# Patient Record
Sex: Female | Born: 1965 | Race: White | Hispanic: No | Marital: Married | Smoking: Never smoker
Health system: Southern US, Community
[De-identification: ages and names within clinical notes are randomized; demographics above are authoritative.]

## PROBLEM LIST (undated history)

## (undated) DIAGNOSIS — H04123 Dry eye syndrome of bilateral lacrimal glands: Secondary | ICD-10-CM

## (undated) DIAGNOSIS — K219 Gastro-esophageal reflux disease without esophagitis: Secondary | ICD-10-CM

## (undated) DIAGNOSIS — J45909 Unspecified asthma, uncomplicated: Secondary | ICD-10-CM

## (undated) HISTORY — DX: Gastro-esophageal reflux disease without esophagitis: K21.9

## (undated) HISTORY — DX: Dry eye syndrome of bilateral lacrimal glands: H04.123

---

## 2008-01-11 HISTORY — PX: SALPINGECTOMY: SHX328

## 2008-04-09 ENCOUNTER — Observation Stay: Payer: Self-pay | Admitting: Obstetrics and Gynecology

## 2010-02-17 ENCOUNTER — Ambulatory Visit: Payer: Self-pay | Admitting: Obstetrics and Gynecology

## 2011-07-08 DIAGNOSIS — H01006 Unspecified blepharitis left eye, unspecified eyelid: Secondary | ICD-10-CM

## 2011-07-08 DIAGNOSIS — H01003 Unspecified blepharitis right eye, unspecified eyelid: Secondary | ICD-10-CM | POA: Insufficient documentation

## 2011-07-08 DIAGNOSIS — H5213 Myopia, bilateral: Secondary | ICD-10-CM | POA: Insufficient documentation

## 2011-07-08 DIAGNOSIS — H04129 Dry eye syndrome of unspecified lacrimal gland: Secondary | ICD-10-CM | POA: Insufficient documentation

## 2011-07-08 DIAGNOSIS — H524 Presbyopia: Secondary | ICD-10-CM | POA: Insufficient documentation

## 2014-09-11 ENCOUNTER — Other Ambulatory Visit: Payer: Self-pay | Admitting: *Deleted

## 2014-09-11 MED ORDER — VITAMIN D (ERGOCALCIFEROL) 1.25 MG (50000 UNIT) PO CAPS: 50000.0000 [IU] | ORAL_CAPSULE | ORAL | Status: DC

## 2014-09-11 MED ORDER — FOLIC ACID 1 MG PO TABS: 1.0000 mg | ORAL_TABLET | Freq: Every day | ORAL | Status: DC

## 2014-12-02 ENCOUNTER — Telehealth: Payer: Self-pay | Admitting: Obstetrics and Gynecology

## 2014-12-02 NOTE — Telephone Encounter (Signed)
Does she need to keep taking folic acid , b/c she takes a muti vit gives her 100% folic acid

## 2014-12-02 NOTE — Telephone Encounter (Signed)
No, it sounds like she is getting enough from the [facility]

## 2014-12-03 NOTE — Telephone Encounter (Signed)
Pt informed not to take extra folic acid.

## 2015-06-11 ENCOUNTER — Encounter: Payer: Self-pay | Admitting: *Deleted

## 2015-06-16 ENCOUNTER — Encounter: Payer: Self-pay | Admitting: Obstetrics and Gynecology

## 2015-06-16 ENCOUNTER — Ambulatory Visit (INDEPENDENT_AMBULATORY_CARE_PROVIDER_SITE_OTHER): Payer: 59 | Admitting: Obstetrics and Gynecology

## 2015-06-16 VITALS — BP 100/54 | HR 57 | Ht 64.0 in | Wt 134.6 lb

## 2015-06-16 DIAGNOSIS — Z01419 Encounter for gynecological examination (general) (routine) without abnormal findings: Secondary | ICD-10-CM

## 2015-06-16 DIAGNOSIS — E559 Vitamin D deficiency, unspecified: Secondary | ICD-10-CM | POA: Diagnosis not present

## 2015-06-16 DIAGNOSIS — E785 Hyperlipidemia, unspecified: Secondary | ICD-10-CM

## 2015-06-16 NOTE — Patient Instructions (Addendum)
I recommend Bjorn PippinAmy Krebs, FNP at [facility] for [facility] care   Place annual gynecologic exam patient instructions here.  Thank you for enrolling in MyChart. Please follow the instructions below to securely access your online medical record. MyChart allows you to send messages to your doctor, view your test results, manage appointments, and more.   How Do I Sign Up? 1. In your Internet browser, go to [employer] Address [employer] and enter [email or web address]. 2. Click on the Sign Up Now link in the Sign In box. You will see the New Member Sign Up page. 3. Enter your MyChart Access Code exactly as it appears below. You will not need to use this code after you've completed the sign-up process. If you do not sign up before the expiration date, you must request a new code.  MyChart Access Code: [email or web address] Expires: 08/14/2015  3:45 PM  4. Enter your Social Security Number ([id number]) and Date of Birth (mm/dd/yyyy) as indicated and click Submit. You will be taken to the next sign-up page. 5. Create a MyChart ID. This will be your MyChart login ID and cannot be changed, so think of one that is secure and easy to remember. 6. Create a MyChart password. You can change your password at any time. 7. Enter your Password Reset Question and Answer. This can be used at a later time if you forget your password.  8. Enter your e-mail address. You will receive e-mail notification when new information is available in MyChart. 9. Click Sign Up. You can now view your medical record.   Additional Information Remember, MyChart is NOT to be used for urgent needs. For medical emergencies, dial 911.

## 2015-06-16 NOTE — Progress Notes (Signed)
Subjective:   Kelly Fleming is a 50 y.o. No obstetric history on file. Caucasian female here for a routine well-woman exam.  Patient's last menstrual period was 06/12/2015 (exact date).    Current complaints: none PCP: me       does desire labs  Social History: Sexual: heterosexual Marital Status: married Living situation: with family Occupation: homemaker Tobacco/alcohol: no tobacco use Illicit drugs: no history of illicit drug use  The following portions of the patient's history were reviewed and updated as appropriate: allergies, current medications, past family history, past medical history, past social history, past surgical history and problem list.  Past Medical History Past Medical History  Diagnosis Date  . Dry eyes   . GERD (gastroesophageal reflux disease)     Past Surgical History Past Surgical History  Procedure Laterality Date  . Salpingectomy  2010    Gynecologic History No obstetric history on file.  Patient's last menstrual period was 06/12/2015 (exact date). Contraception: none Last Pap: 2015. Results were: normal Last mammogram: 2012. Results were: normal  Obstetric History OB History  No data available    Current Medications Current Outpatient Prescriptions on File Prior to Visit  Medication Sig Dispense Refill  . Vitamin D, Ergocalciferol, (DRISDOL) 50000 UNITS CAPS capsule Take 1 capsule (50,000 Units total) by mouth every 7 (seven) days. 12 capsule 4   No current facility-administered medications on file prior to visit.    Review of Systems Patient denies any headaches, blurred vision, shortness of breath, chest pain, abdominal pain, problems with bowel movements, urination, or intercourse.  Objective:  BP 100/54 mmHg  Pulse 57  Ht 5\' 4"  (1.626 m)  Wt 134 lb 9 oz (61.037 kg)  BMI 23.09 kg/m2  LMP 06/12/2015 (Exact Date) Physical Exam  General:  Well developed, well nourished, no acute distress. She is alert and oriented x3. Skin:   Warm and dry Neck:  Midline trachea, no thyromegaly or nodules Cardiovascular: Regular rate and rhythm, no murmur heard Lungs:  Effort normal, all lung fields clear to auscultation bilaterally Breasts:  No dominant palpable mass, retraction, or nipple discharge Abdomen:  Soft, non tender, no hepatosplenomegaly or masses Pelvic:  External genitalia is normal in appearance.  The vagina is normal in appearance. The cervix is bulbous, no CMT.  Thin prep pap is not done  . Uterus is felt to be normal size, shape, and contour, but fibrous.  No adnexal masses or tenderness noted.Extremities:  No swelling or varicosities noted Psych:  She has a normal mood and affect  Assessment:   Healthy well-woman exam Hyperlipidemia H/O vit D Deficiency  Plan:  Labs obtained F/U 1 year for AE, or sooner if needed Mammogram scheduled  Vasily Fedewa Suzan NailerN Timera Windt, CNM

## 2015-06-19 LAB — COMPREHENSIVE METABOLIC PANEL
A/G RATIO: 1.6 (ref 1.2–2.2)
ALBUMIN: 4.5 g/dL (ref 3.5–5.5)
ALK PHOS: 57 IU/L (ref 39–117)
ALT: 16 IU/L (ref 0–32)
AST: 22 IU/L (ref 0–40)
BUN / CREAT RATIO: 18 (ref 9–23)
BUN: 11 mg/dL (ref 6–24)
Bilirubin Total: 0.3 mg/dL (ref 0.0–1.2)
CHLORIDE: 99 mmol/L (ref 96–106)
CO2: 26 mmol/L (ref 18–29)
Calcium: 9.3 mg/dL (ref 8.7–10.2)
Creatinine, Ser: 0.62 mg/dL (ref 0.57–1.00)
GFR calc Af Amer: 122 mL/min/{1.73_m2} (ref >59)
GFR calc non Af Amer: 106 mL/min/{1.73_m2} (ref >59)
GLOBULIN, TOTAL: 2.8 g/dL (ref 1.5–4.5)
Glucose: 91 mg/dL (ref 65–99)
POTASSIUM: 4.4 mmol/L (ref 3.5–5.2)
SODIUM: 140 mmol/L (ref 134–144)
Total Protein: 7.3 g/dL (ref 6.0–8.5)

## 2015-06-19 LAB — NMR, LIPOPROFILE
Cholesterol: 192 mg/dL (ref 100–199)
HDL Cholesterol by NMR: 53 mg/dL
HDL Particle Number: 31.7 umol/L
LDL Particle Number: 1348 nmol/L — ABNORMAL HIGH
LDL Size: 21.7 nm
LDL-C: 113 mg/dL — ABNORMAL HIGH (ref 0–99)
LP-IR Score: <25
Small LDL Particle Number: 350 nmol/L
Triglycerides by NMR: 129 mg/dL (ref 0–149)

## 2015-06-19 LAB — VITAMIN D 25 HYDROXY (VIT D DEFICIENCY, FRACTURES): VIT D 25 HYDROXY: 55.6 ng/mL (ref 30.0–100.0)

## 2015-06-24 ENCOUNTER — Ambulatory Visit
Admission: RE | Admit: 2015-06-24 | Discharge: 2015-06-24 | Disposition: A | Discharge status: Home / Self Care | Payer: 59 | Source: Ambulatory Visit | Attending: Obstetrics and Gynecology | Admitting: Obstetrics and Gynecology

## 2015-06-24 DIAGNOSIS — Z1231 Encounter for screening mammogram for malignant neoplasm of breast: Secondary | ICD-10-CM | POA: Diagnosis present

## 2015-06-24 DIAGNOSIS — Z01419 Encounter for gynecological examination (general) (routine) without abnormal findings: Secondary | ICD-10-CM

## 2015-06-25 ENCOUNTER — Other Ambulatory Visit: Payer: Self-pay | Admitting: Obstetrics and Gynecology

## 2015-06-25 DIAGNOSIS — R928 Other abnormal and inconclusive findings on diagnostic imaging of breast: Secondary | ICD-10-CM

## 2015-07-10 ENCOUNTER — Ambulatory Visit
Admission: RE | Admit: 2015-07-10 | Discharge: 2015-07-10 | Disposition: A | Discharge status: Home / Self Care | Payer: 59 | Source: Ambulatory Visit | Attending: Obstetrics and Gynecology | Admitting: Obstetrics and Gynecology

## 2015-07-10 DIAGNOSIS — R928 Other abnormal and inconclusive findings on diagnostic imaging of breast: Secondary | ICD-10-CM

## 2015-07-10 DIAGNOSIS — N6002 Solitary cyst of left breast: Secondary | ICD-10-CM | POA: Diagnosis not present

## 2015-07-22 ENCOUNTER — Telehealth: Payer: Self-pay | Admitting: *Deleted

## 2015-07-22 NOTE — Telephone Encounter (Signed)
Mailed letter to pt

## 2015-07-22 NOTE — Telephone Encounter (Signed)
-----   Message from Purcell NailsMelody N Shambley, [place] sent at 07/10/2015  2:02 PM EDT ----- Please let her know I have reviewed her follow MMG and ultrasound, and agree with recommended follow up in 6 months.

## 2015-09-08 ENCOUNTER — Other Ambulatory Visit: Payer: Self-pay | Admitting: *Deleted

## 2015-09-08 MED ORDER — VITAMIN D (ERGOCALCIFEROL) 1.25 MG (50000 UNIT) PO CAPS: 50000.0000 [IU] | ORAL_CAPSULE | ORAL | 4 refills | Status: DC

## 2016-06-16 ENCOUNTER — Encounter: Payer: 59 | Admitting: Obstetrics and Gynecology

## 2016-08-19 ENCOUNTER — Telehealth: Payer: Self-pay | Admitting: Obstetrics and Gynecology

## 2016-08-19 NOTE — Telephone Encounter (Signed)
Patient would like to have mammogram the same day as she has her AE - could you put in the order so that she can schedule that please  Thank you

## 2016-08-22 ENCOUNTER — Telehealth: Payer: Self-pay | Admitting: Obstetrics and Gynecology

## 2016-08-22 NOTE — Telephone Encounter (Signed)
Patient called requesting an order for her diagnostic mammo. She wants to have her mammo done on the same day as her annual if possible but needs the order in. Thanks

## 2016-08-23 ENCOUNTER — Other Ambulatory Visit: Payer: Self-pay | Admitting: Obstetrics and Gynecology

## 2016-08-23 DIAGNOSIS — R928 Other abnormal and inconclusive findings on diagnostic imaging of breast: Secondary | ICD-10-CM

## 2016-08-23 NOTE — Telephone Encounter (Signed)
Please let her know orders are in so she can schedule.

## 2016-09-09 ENCOUNTER — Encounter: Payer: 59 | Admitting: Obstetrics and Gynecology

## 2016-09-09 ENCOUNTER — Telehealth: Payer: Self-pay | Admitting: Obstetrics and Gynecology

## 2016-09-09 NOTE — Telephone Encounter (Signed)
The patient called and stated that she asked her pharmacy to send a medication refill request, The patient stated that she still does not have her medication. The patient did not disclose any other information, Please advise.

## 2016-09-16 ENCOUNTER — Other Ambulatory Visit: Payer: Self-pay | Admitting: *Deleted

## 2016-09-16 MED ORDER — VITAMIN D (ERGOCALCIFEROL) 1.25 MG (50000 UNIT) PO CAPS: 50000.0000 [IU] | ORAL_CAPSULE | ORAL | 4 refills | Status: DC

## 2016-09-20 ENCOUNTER — Ambulatory Visit
Admission: RE | Admit: 2016-09-20 | Discharge: 2016-09-20 | Disposition: A | Discharge status: Home / Self Care | Payer: Managed Care, Other (non HMO) | Source: Ambulatory Visit | Attending: Obstetrics and Gynecology | Admitting: Obstetrics and Gynecology

## 2016-09-20 ENCOUNTER — Other Ambulatory Visit: Payer: Self-pay | Admitting: Obstetrics and Gynecology

## 2016-09-20 ENCOUNTER — Ambulatory Visit (INDEPENDENT_AMBULATORY_CARE_PROVIDER_SITE_OTHER): Payer: Managed Care, Other (non HMO) | Admitting: Obstetrics and Gynecology

## 2016-09-20 ENCOUNTER — Encounter: Payer: Self-pay | Admitting: Obstetrics and Gynecology

## 2016-09-20 VITALS — BP 95/47 | HR 73 | Ht 64.0 in | Wt 133.3 lb

## 2016-09-20 DIAGNOSIS — Z01419 Encounter for gynecological examination (general) (routine) without abnormal findings: Secondary | ICD-10-CM

## 2016-09-20 DIAGNOSIS — R928 Other abnormal and inconclusive findings on diagnostic imaging of breast: Secondary | ICD-10-CM

## 2016-09-20 DIAGNOSIS — E785 Hyperlipidemia, unspecified: Secondary | ICD-10-CM

## 2016-09-20 MED ORDER — VITAMIN D (ERGOCALCIFEROL) 1.25 MG (50000 UNIT) PO CAPS: 50000.0000 [IU] | ORAL_CAPSULE | ORAL | 4 refills | Status: DC

## 2016-09-20 NOTE — Progress Notes (Signed)
Subjective:   Kelly Fleming is a 51 y.o. No obstetric history on file. Caucasian female here for a routine well-woman exam.  Patient's last menstrual period was 09/15/2016.    Current complaints: occasional night sweats. Monthly menses. Walk/run, cardio, gardening.  PCP: me       does desire labs  Social History: Sexual: heterosexual Marital Status: married Living situation: with family Occupation: homemaker Tobacco/alcohol: no tobacco use Illicit drugs: no history of illicit drug use  The following portions of the patient's history were reviewed and updated as appropriate: allergies, current medications, past family history, past medical history, past social history, past surgical history and problem list.  Past Medical History Past Medical History:  Diagnosis Date  . Dry eyes   . GERD (gastroesophageal reflux disease)     Past Surgical History Past Surgical History:  Procedure Laterality Date  . SALPINGECTOMY  2010    Gynecologic History No obstetric history on file.  Patient's last menstrual period was 09/15/2016. Contraception: tubal ligation Last Pap: 2015. Results were: normal Last mammogram: 2017. Results were: left breast cyst otherwise normal   Obstetric History OB History  No data available    Current Medications Current Outpatient Prescriptions on File Prior to Visit  Medication Sig Dispense Refill  . XIIDRA 5 % SOLN INSTILL 1 DROP OU BID  6   No current facility-administered medications on file prior to visit.     Review of Systems Patient denies any headaches, blurred vision, shortness of breath, chest pain, abdominal pain, problems with bowel movements, urination, or intercourse.  Objective:  BP (!) 95/47   Pulse 73   Ht 5\' 4"  (1.626 m)   Wt 133 lb 4.8 oz (60.5 kg)   LMP 09/15/2016   BMI 22.88 kg/m  Physical Exam  General:  Well developed, well nourished, no acute distress. She is alert and oriented x3. Skin:  Warm and dry Neck:   Midline trachea, no thyromegaly or nodules Cardiovascular: Regular rate and rhythm, no murmur heard Lungs:  Effort normal, all lung fields clear to auscultation bilaterally Breasts:  No dominant palpable mass, retraction, or nipple discharge Abdomen:  Soft, non tender, no hepatosplenomegaly or masses Pelvic:  External genitalia is normal in appearance.  The vagina is normal in appearance. The cervix is bulbous, no CMT.  Thin prep pap is done with HR HPV cotesting. Uterus is felt to be normal size, shape, and contour.  No adnexal masses or tenderness noted. Extremities:  No swelling or varicosities noted Psych:  She has a normal mood and affect  Assessment:   Healthy well-woman exam Hyperlipidema   Plan:  Labs obtained and will follow up accordingly F/U 1 Year for Ae, or sooner if needed Mammogram ordered Colonoscopy needed  Margaruite Top Suzan NailerN Jermane Brayboy, CNM

## 2016-09-21 LAB — COMPREHENSIVE METABOLIC PANEL
ALBUMIN: 4.5 g/dL (ref 3.5–5.5)
ALK PHOS: 55 IU/L (ref 39–117)
ALT: 12 IU/L (ref 0–32)
AST: 18 IU/L (ref 0–40)
Albumin/Globulin Ratio: 1.8 (ref 1.2–2.2)
BUN / CREAT RATIO: 19 (ref 9–23)
BUN: 17 mg/dL (ref 6–24)
Bilirubin Total: 0.2 mg/dL (ref 0.0–1.2)
CALCIUM: 9.5 mg/dL (ref 8.7–10.2)
CO2: 25 mmol/L (ref 20–29)
CREATININE: 0.89 mg/dL (ref 0.57–1.00)
Chloride: 102 mmol/L (ref 96–106)
GFR, EST AFRICAN AMERICAN: 87 mL/min/{1.73_m2} (ref >59)
GFR, EST NON AFRICAN AMERICAN: 76 mL/min/{1.73_m2} (ref >59)
GLUCOSE: 87 mg/dL (ref 65–99)
Globulin, Total: 2.5 g/dL (ref 1.5–4.5)
Potassium: 5.1 mmol/L (ref 3.5–5.2)
Sodium: 142 mmol/L (ref 134–144)
TOTAL PROTEIN: 7 g/dL (ref 6.0–8.5)

## 2016-09-21 LAB — NMR, LIPOPROFILE
CHOLESTEROL: 207 mg/dL — AB (ref 100–199)
HDL CHOLESTEROL BY NMR: 58 mg/dL (ref >39)
HDL Particle Number: 31.6 umol/L (ref >=30.5)
LDL Particle Number: 1307 nmol/L — ABNORMAL HIGH (ref <1000)
LDL Size: 22.1 nm (ref >20.5)
LDL-C: 125 mg/dL — ABNORMAL HIGH (ref 0–99)
LP-IR Score: <25 (ref <=45)
SMALL LDL PARTICLE NUMBER: 185 nmol/L (ref <=527)
TRIGLYCERIDES BY NMR: 122 mg/dL (ref 0–149)

## 2016-09-21 LAB — HEMOGLOBIN A1C
ESTIMATED AVERAGE GLUCOSE: 100 mg/dL
Hgb A1c MFr Bld: 5.1 % (ref 4.8–5.6)

## 2016-09-21 LAB — VITAMIN D 25 HYDROXY (VIT D DEFICIENCY, FRACTURES): VIT D 25 HYDROXY: 58.9 ng/mL (ref 30.0–100.0)

## 2016-09-22 LAB — CYTOLOGY - PAP

## 2016-10-04 ENCOUNTER — Telehealth: Payer: Self-pay | Admitting: *Deleted

## 2016-10-04 NOTE — Telephone Encounter (Signed)
Mailed info to pt 

## 2016-10-06 ENCOUNTER — Telehealth: Payer: Self-pay | Admitting: Obstetrics and Gynecology

## 2016-10-06 NOTE — Telephone Encounter (Signed)
Patient called and stated that she has a couple of questions to ask Amy or Melody. Patient did not disclose any other information other that wanting to speak with someone. Please advise.

## 2016-10-07 NOTE — Telephone Encounter (Signed)
Pt would like for dx code to be changed for her vit d

## 2016-10-07 NOTE — Telephone Encounter (Signed)
Pt LVM asking that Melody or Amy return her call b/c she has questions about a procedure that was done. Please advise. Thanks TNP

## 2016-10-26 ENCOUNTER — Other Ambulatory Visit: Payer: Self-pay

## 2016-10-26 ENCOUNTER — Telehealth: Payer: Self-pay

## 2016-10-26 DIAGNOSIS — Z1211 Encounter for screening for malignant neoplasm of colon: Secondary | ICD-10-CM

## 2016-10-26 NOTE — Telephone Encounter (Signed)
Gastroenterology Pre-Procedure Review  Request Date:  Requesting Physician: Dr.   PATIENT REVIEW QUESTIONS: The patient responded to the following health history questions as indicated:    1. Are you having any GI issues? no 2. Do you have a personal history of Polyps? no 3. Do you have a family history of Colon Cancer or Polyps? no 4. Diabetes Mellitus? no 5. Joint replacements in the past 12 months?no 6. Major health problems in the past 3 months?no 7. Any artificial heart valves, MVP, or defibrillator?no    MEDICATIONS & ALLERGIES:    Patient reports the following regarding taking any anticoagulation/antiplatelet therapy:   Plavix, Coumadin, Eliquis, Xarelto, Lovenox, Pradaxa, Brilinta, or Effient? no Aspirin? no  Patient confirms/reports the following medications:  Current Outpatient Prescriptions  Medication Sig Dispense Refill  . Vitamin D, Ergocalciferol, (DRISDOL) 50000 units CAPS capsule Take 1 capsule (50,000 Units total) by mouth every 7 (seven) days. 30 capsule 4  . XIIDRA 5 % SOLN INSTILL 1 DROP OU BID  6   No current facility-administered medications for this visit.     Patient confirms/reports the following allergies:  Allergies  Allergen Reactions  . Iodinated Diagnostic Agents Anaphylaxis    Uncoded Allergy. Allergen: freshwater fish  . Fish Allergy   . Penicillins     No orders of the defined types were placed in this encounter.   AUTHORIZATION INFORMATION Primary Insurance: 1D#: Group #:  Secondary Insurance: 1D#: Group #:  SCHEDULE INFORMATION: Date: 11/25/16 Time: Location: [facility]

## 2016-11-21 ENCOUNTER — Encounter: Payer: Self-pay | Admitting: *Deleted

## 2016-11-22 NOTE — Discharge Instructions (Signed)
General Anesthesia, Adult, Care After °These instructions provide you with information about caring for yourself after your procedure. Your health care provider may also give you more specific instructions. Your treatment has been planned according to current medical practices, but problems sometimes occur. Call your health care provider if you have any problems or questions after your procedure. °What can I expect after the procedure? °After the procedure, it is common to have: °· Vomiting. °· A sore throat. °· Mental slowness. ° °It is common to feel: °· Nauseous. °· Cold or shivery. °· Sleepy. °· Tired. °· Sore or achy, even in parts of your body where you did not have surgery. ° °Follow these instructions at home: °For at least 24 hours after the procedure: °· Do not: °? Participate in activities where you could fall or become injured. °? Drive. °? Use heavy machinery. °? Drink alcohol. °? Take sleeping pills or medicines that cause drowsiness. °? Make important decisions or sign legal documents. °? Take care of children on your own. °· Rest. °Eating and drinking °· If you vomit, drink water, juice, or soup when you can drink without vomiting. °· Drink enough fluid to keep your urine clear or pale yellow. °· Make sure you have little or no nausea before eating solid foods. °· Follow the diet recommended by your health care provider. °General instructions °· Have a responsible adult stay with you until you are awake and alert. °· Return to your normal activities as told by your health care provider. Ask your health care provider what activities are safe for you. °· Take over-the-counter and prescription medicines only as told by your health care provider. °· If you smoke, do not smoke without supervision. °· Keep all follow-up visits as told by your health care provider. This is important. °Contact a health care provider if: °· You continue to have nausea or vomiting at home, and medicines are not helpful. °· You  cannot drink fluids or start eating again. °· You cannot urinate after 8-12 hours. °· You develop a skin rash. °· You have fever. °· You have increasing redness at the site of your procedure. °Get help right away if: °· You have difficulty breathing. °· You have chest pain. °· You have unexpected bleeding. °· You feel that you are having a life-threatening or urgent problem. °This information is not intended to replace advice given to you by your health care provider. Make sure you discuss any questions you have with your health care provider. °Document Released: 04/04/2000 Document Revised: 06/01/2015 Document Reviewed: 12/11/2014 °Elsevier Interactive Patient Education © 2018 Elsevier Inc. ° °

## 2016-11-25 ENCOUNTER — Ambulatory Visit
Admission: RE | Admit: 2016-11-25 | Discharge: 2016-11-25 | Disposition: A | Discharge status: Home / Self Care | Payer: Managed Care, Other (non HMO) | Source: Ambulatory Visit | Attending: Gastroenterology | Admitting: Gastroenterology

## 2016-11-25 ENCOUNTER — Encounter
Admission: RE | Disposition: A | Discharge status: Home / Self Care | Payer: Self-pay | Source: Ambulatory Visit | Attending: Gastroenterology

## 2016-11-25 ENCOUNTER — Ambulatory Visit: Payer: Managed Care, Other (non HMO) | Admitting: Anesthesiology

## 2016-11-25 DIAGNOSIS — Z1211 Encounter for screening for malignant neoplasm of colon: Secondary | ICD-10-CM

## 2016-11-25 DIAGNOSIS — K640 First degree hemorrhoids: Secondary | ICD-10-CM | POA: Diagnosis not present

## 2016-11-25 DIAGNOSIS — Z79899 Other long term (current) drug therapy: Secondary | ICD-10-CM | POA: Diagnosis not present

## 2016-11-25 DIAGNOSIS — K219 Gastro-esophageal reflux disease without esophagitis: Secondary | ICD-10-CM | POA: Diagnosis not present

## 2016-11-25 HISTORY — PX: COLONOSCOPY WITH PROPOFOL: SHX5780

## 2016-11-25 HISTORY — DX: Unspecified asthma, uncomplicated: J45.909

## 2016-11-25 SURGERY — COLONOSCOPY WITH PROPOFOL
Anesthesia: General | Site: Rectum | Wound class: Contaminated

## 2016-11-25 MED ORDER — SIMETHICONE 40 MG/0.6ML PO SUSP
ORAL | Status: DC | PRN
  Administered 2016-11-25: 10:00:00

## 2016-11-25 MED ORDER — ACETAMINOPHEN 160 MG/5ML PO SOLN: 325.0000 mg | ORAL | Status: DC | PRN

## 2016-11-25 MED ORDER — LACTATED RINGERS IV SOLN
1000.0000 mL | INTRAVENOUS | Status: DC
  Administered 2016-11-25: 10:00:00 via INTRAVENOUS

## 2016-11-25 MED ORDER — ACETAMINOPHEN 325 MG PO TABS: 325.0000 mg | ORAL_TABLET | ORAL | Status: DC | PRN

## 2016-11-25 MED ORDER — PROPOFOL 10 MG/ML IV BOLUS
INTRAVENOUS | Status: DC | PRN
  Administered 2016-11-25: 50 mg via INTRAVENOUS
  Administered 2016-11-25: 100 mg via INTRAVENOUS
  Administered 2016-11-25: 20 mg via INTRAVENOUS
  Administered 2016-11-25 (×2): 30 mg via INTRAVENOUS

## 2016-11-25 MED ORDER — LIDOCAINE HCL (CARDIAC) 20 MG/ML IV SOLN
INTRAVENOUS | Status: DC | PRN
  Administered 2016-11-25: 50 mg via INTRAVENOUS

## 2016-11-25 MED ORDER — PHENYLEPHRINE HCL 10 MG/ML IJ SOLN
INTRAMUSCULAR | Status: DC | PRN
  Administered 2016-11-25 (×2): 50 ug via INTRAVENOUS

## 2016-11-25 SURGICAL SUPPLY — 23 items

## 2016-11-25 NOTE — H&P (Signed)
   Midge Miniumarren Jordin Dambrosio, MD [facility] [address] [place], [place] [address] Phone: [phone] Fax : [phone]  Primary Care Physician:  Patient, No Pcp Per Primary Gastroenterologist:  Dr. Servando SnareWohl  Pre-Procedure History & Physical: HPI:  Kelly Numberlison Doubek is a 51 y.o. female is here for a screening colonoscopy.   Past Medical History:  Diagnosis Date  . Childhood asthma   . Dry eyes   . Family history of adverse reaction to anesthesia    Maternal aunt - Anesthesia caused onset of dementia  . GERD (gastroesophageal reflux disease)     Past Surgical History:  Procedure Laterality Date  . SALPINGECTOMY  2010    Prior to Admission medications   Medication Sig Start Date End Date Taking? Authorizing Provider  Vitamin D, Ergocalciferol, (DRISDOL) 50000 units CAPS capsule Take 1 capsule (50,000 Units total) by mouth every 7 (seven) days. 09/20/16  Yes Shambley, Melody N, CNM  XIIDRA 5 % SOLN INSTILL 1 DROP OU BID 04/06/15   [provider]    Allergies as of 10/26/2016 - Review Complete 09/20/2016  Allergen Reaction Noted  . Iodinated diagnostic agents Anaphylaxis 06/16/2015  . Fish allergy  06/11/2015  . Penicillins  06/11/2015    Family History  Problem Relation Age of Onset  . Breast cancer Cousin        mat cousin  . Diabetes Father   . Diabetes Maternal Grandfather   . Hyperlipidemia Maternal Grandfather   . Diabetes Paternal Grandfather   . Hyperlipidemia Paternal Grandfather     Social History   Socioeconomic History  . Marital status: Married    Spouse name: Not on file  . Number of children: Not on file  . Years of education: Not on file  . Highest education level: Not on file  Social Needs  . Financial resource strain: Not on file  . Food insecurity - worry: Not on file  . Food insecurity - inability: Not on file  . Transportation needs - medical: Not on file  . Transportation needs - non-medical: Not on file  Occupational History  . Not on  file  Tobacco Use  . Smoking status: Never Smoker  . Smokeless tobacco: Never Used  Substance and Sexual Activity  . Alcohol use: No  . Drug use: No  . Sexual activity: Yes  Other Topics Concern  . Not on file  Social History Narrative  . Not on file    Review of Systems: See HPI, otherwise negative ROS  Physical Exam: BP (!) 97/43   Pulse 60   Temp (!) 97.5 F (36.4 C) (Temporal)   Ht 5\' 4"  (1.626 m)   Wt 128 lb (58.1 kg)   LMP 11/08/2016 (Approximate)   SpO2 100%   BMI 21.97 kg/m  General:   Alert,  pleasant and cooperative in NAD Head:  Normocephalic and atraumatic. Neck:  Supple; no masses or thyromegaly. Lungs:  Clear throughout to auscultation.    Heart:  Regular rate and rhythm. Abdomen:  Soft, nontender and nondistended. Normal bowel sounds, without guarding, and without rebound.   Neurologic:  Alert and  oriented x4;  grossly normal neurologically.  Impression/Plan: Kelly Fleming is now here to undergo a screening colonoscopy.  Risks, benefits, and alternatives regarding colonoscopy have been reviewed with the patient.  Questions have been answered.  All parties agreeable.

## 2016-11-25 NOTE — Op Note (Signed)
[facility] Gastroenterology Patient Name: Kelly Fleming Procedure Date: 11/25/2016 10:05 AM MRN: [id number] Account #: [id number] Date of Birth: [birth year] Admit Type: Outpatient Age: [age] [facility] 01 Gender: Female Note Status: Finalized Procedure:            Colonoscopy Indications:          Screening for colorectal malignant neoplasm Providers:            Midge Miniumarren Brinley Treanor MD, MD Medicines:            Propofol per Anesthesia Complications:        No immediate complications. Procedure:            Pre-Anesthesia Assessment:                       - Prior to the procedure, a History and Physical was                        performed, and patient medications and allergies were                        reviewed. The patient's tolerance of previous                        anesthesia was also reviewed. The risks and benefits of                        the procedure and the sedation options and risks were                        discussed with the patient. All questions were                        answered, and informed consent was obtained. Prior                        Anticoagulants: The patient has taken no previous                        anticoagulant or antiplatelet agents. ASA Grade                        Assessment: II - A patient with mild systemic disease.                        After reviewing the risks and benefits, the patient was                        deemed in satisfactory condition to undergo the                        procedure.                       After obtaining informed consent, the colonoscope was                        passed under direct vision. Throughout the procedure,                        the patient's blood pressure,  pulse, and oxygen                        saturations were monitored continuously. The Olympus CF                        H180AL Colonoscope (S#: [id number]) was introduced through                        the anus and advanced to  the the cecum, identified by                        appendiceal orifice and ileocecal valve. The                        colonoscopy was performed without difficulty. The                        patient tolerated the procedure well. The quality of                        the bowel preparation was excellent. Findings:      The perianal and digital rectal examinations were normal.      Non-bleeding internal hemorrhoids were found during retroflexion. The       hemorrhoids were Grade I (internal hemorrhoids that do not prolapse). Impression:           - Non-bleeding internal hemorrhoids.                       - No specimens collected. Recommendation:       - Discharge patient to home.                       - Resume previous diet.                       - Continue present medications.                       - Repeat colonoscopy in 10 years for screening unless                        any change in family history or lower GI problems. Procedure Code(s):    --- Professional ---                       [phone], Colonoscopy, flexible; diagnostic, including                        collection of specimen(s) by brushing or washing, when                        performed (separate procedure) Diagnosis Code(s):    --- Professional ---                       Z12.11, Encounter for screening for malignant neoplasm                        of colon CPT copyright 2016 American Medical Association. All rights reserved. The codes documented in this report are preliminary and upon coder review  may  be revised to meet current compliance requirements. Midge Minium MD, MD 11/25/2016 10:28:39 AM This report has been signed electronically. Number of Addenda: 0 Note Initiated On: 11/25/2016 10:05 AM Scope Withdrawal Time: 0 hours 6 minutes 6 seconds  Total Procedure Duration: 0 hours 12 minutes 44 seconds       [facility]

## 2016-11-25 NOTE — Transfer of Care (Signed)
Immediate Anesthesia Transfer of Care Note  Patient: Kelly Fleming  Procedure(s) Performed: COLONOSCOPY WITH PROPOFOL (N/A Rectum)  Patient Location: PACU  Anesthesia Type: General  Level of Consciousness: awake, alert  and patient cooperative  Airway and Oxygen Therapy: Patient Spontanous Breathing and Patient connected to supplemental oxygen  Post-op Assessment: Post-op Vital signs reviewed, Patient's Cardiovascular Status Stable, Respiratory Function Stable, Patent Airway and No signs of Nausea or vomiting  Post-op Vital Signs: Reviewed and stable  Complications: No apparent anesthesia complications

## 2016-11-25 NOTE — Anesthesia Preprocedure Evaluation (Signed)
Anesthesia Evaluation  Patient identified by MRN, date of birth, ID band Patient awake    Reviewed: Allergy & Precautions, H&P , NPO status , Patient's Chart, lab work & pertinent test results, reviewed documented beta blocker date and time   Airway Mallampati: II  TM Distance: >3 FB Neck ROM: full    Dental no notable dental hx.    Pulmonary neg pulmonary ROS,    Pulmonary exam normal breath sounds clear to auscultation       Cardiovascular Exercise Tolerance: Good negative cardio ROS Normal cardiovascular exam Rhythm:regular Rate:Normal     Neuro/Psych negative neurological ROS  negative psych ROS   GI/Hepatic Neg liver ROS, GERD  ,  Endo/Other  negative endocrine ROS  Renal/GU negative Renal ROS  negative genitourinary   Musculoskeletal   Abdominal   Peds  Hematology negative hematology ROS (+)   Anesthesia Other Findings   Reproductive/Obstetrics negative OB ROS                             Anesthesia Physical Anesthesia Plan  ASA: II  Anesthesia Plan: General   Post-op Pain Management:    Induction:   PONV Risk Score and Plan:   Airway Management Planned:   Additional Equipment:   Intra-op Plan:   Post-operative Plan:   Informed Consent: I have reviewed the patients History and Physical, chart, labs and discussed the procedure including the risks, benefits and alternatives for the proposed anesthesia with the patient or authorized representative who has indicated his/her understanding and acceptance.   Dental Advisory Given  Plan Discussed with: CRNA and Anesthesiologist  Anesthesia Plan Comments:         Anesthesia Quick Evaluation  

## 2016-11-25 NOTE — Anesthesia Postprocedure Evaluation (Signed)
Anesthesia Post Note  Patient: Kelly Fleming  Procedure(s) Performed: COLONOSCOPY WITH PROPOFOL (N/A Rectum)  Patient location during evaluation: PACU Anesthesia Type: General Level of consciousness: awake and alert Pain management: pain level controlled Vital Signs Assessment: post-procedure vital signs reviewed and stable Respiratory status: spontaneous breathing, nonlabored ventilation, respiratory function stable and patient connected to nasal cannula oxygen Cardiovascular status: blood pressure returned to baseline and stable Postop Assessment: no apparent nausea or vomiting Anesthetic complications: no    Alta CorningBacon, Barbar Brede S

## 2016-11-25 NOTE — Anesthesia Procedure Notes (Signed)
Date/Time: 11/25/2016 10:08 AM Performed by: Maree KrabbeWarren, Atziri Zubiate, CRNA Pre-anesthesia Checklist: Patient identified, Emergency Drugs available, Suction available, Timeout performed and Patient being monitored Patient Re-evaluated:Patient Re-evaluated prior to induction Oxygen Delivery Method: Nasal cannula Placement Confirmation: positive ETCO2

## 2017-04-14 ENCOUNTER — Encounter: Payer: Self-pay | Admitting: Emergency Medicine

## 2017-04-14 ENCOUNTER — Ambulatory Visit
Admission: EM | Admit: 2017-04-14 | Discharge: 2017-04-14 | Disposition: A | Discharge status: Home / Self Care | Payer: BLUE CROSS/BLUE SHIELD | Attending: Emergency Medicine | Admitting: Emergency Medicine

## 2017-04-14 ENCOUNTER — Other Ambulatory Visit: Payer: Self-pay

## 2017-04-14 DIAGNOSIS — R300 Dysuria: Secondary | ICD-10-CM

## 2017-04-14 DIAGNOSIS — N3001 Acute cystitis with hematuria: Secondary | ICD-10-CM | POA: Diagnosis not present

## 2017-04-14 LAB — URINALYSIS, COMPLETE (UACMP) WITH MICROSCOPIC
BILIRUBIN URINE: NEGATIVE
Glucose, UA: NEGATIVE mg/dL
KETONES UR: NEGATIVE mg/dL
NITRITE: POSITIVE — AB
PROTEIN: NEGATIVE mg/dL
Specific Gravity, Urine: 1.025 (ref 1.005–1.030)
pH: 5.5 (ref 5.0–8.0)

## 2017-04-14 MED ORDER — NITROFURANTOIN MONOHYD MACRO 100 MG PO CAPS: 100.0000 mg | ORAL_CAPSULE | Freq: Two times a day (BID) | ORAL | 0 refills | Status: DC

## 2017-04-14 MED ORDER — PHENAZOPYRIDINE HCL 200 MG PO TABS: 200.0000 mg | ORAL_TABLET | Freq: Three times a day (TID) | ORAL | 0 refills | Status: DC | PRN

## 2017-04-14 NOTE — Discharge Instructions (Addendum)
Here is a list of primary care providers who are taking new patients: ° °Dr. Deanna Jones, Dr. William Plonk °[address] °[address] °[place] [address] °[phone] ° °[facility] [place] °[address]  °[place] [address]  °[phone] ° °[facility] °[address]  °[place], [place] [address] °[phone] ° °[facility] [place] °[address]  °[phone] °[place], [place] [address] ° °Here are clinics/ other resources who will see you if you do not have insurance. Some have certain criteria that you must meet. Call them and find out what they are: ° °[facility]: °[address] [place] [place], [place] [address] °Phone: [phone] °Hours: First and Third Saturdays of each Month, 9 a.m. - 1 p.m. ° °[facility]: °[address], [place], [place] [address] °Phone: [phone] °Hours: °Tuesday, 4 p.m. - 8 p.m. °Thursday, 1 p.m. - 8 p.m. °Wednesday, 9 a.m. - Noon ° °[facility] °[address], [place], [place] [address] °Phone: [phone] °Pharmacy Phone Number: [phone] °Dental Phone Number: [phone] °ACA Insurance Help: [phone] ° °Dental Hours: °Monday - Thursday, 8 a.m. - 6 p.m. ° °[facility] °[address] [place], [place] [address] °Phone: [phone] °Pharmacy Phone Number: [phone] °ACA Insurance Help: [phone] ° °[facility] °[address] [place], [place] [address] °Phone: [phone] °Pharmacy Phone Number: [phone] °ACA Insurance Help: [phone] ° °[facility] °[address] [place], [place] [address] °Phone: [phone] °ACA Insurance Help: [phone]  ° °[facility] °[address] [place],  [address] °Phone: [phone] ° °Go to www.goodrx.com to look up your medications. This will give you a list of where you can find your prescriptions at the most affordable prices. Or ask the pharmacist what the cash price is,  or if they have any other discount programs available to help make your medication more affordable. This can be less expensive than what you would pay with insurance.   °

## 2017-04-14 NOTE — ED Triage Notes (Signed)
Patient burning when urinating and suprapubic pain that started on Monday.

## 2017-04-14 NOTE — ED Provider Notes (Signed)
HPI  SUBJECTIVE:  Kelly Fleming is a 52 y.o. female who presents with 1 week of dysuria, burning with urination, cloudy, odorous urine.  She states that she has low midline pelvic pressure and cramping with urinating only.  She denies nausea, vomiting, fevers, urinary urgency, frequency, hematuria, abdominal or back pain.  No vaginal bleeding, odor, rash, itching, discharge.  She is sexually active with her husband of 31 years who is asymptomatic.  STDs are not a concern today.  No antibiotics in the past month.  No antipyretic in the past 6-8 hours.  She does not use any perfumed body washes or soaps.  She tried cranberry juice and cranberry extract with mild, temporary improvement in her symptoms.  Symptoms are worse with urinating.  She has a past medical history of UTIs and states that this feels similar to that.  No history of pyelonephritis, nephrolithiasis, diabetes, hypertension, PID.  No history of gonorrhea, chlamydia, herpes, HIV, syphilis, Trichomonas, BV.  Remote history of vaginal yeast infections.  PMD: None.    Past Medical History:  Diagnosis Date  . Childhood asthma   . Dry eyes   . Family history of adverse reaction to anesthesia    Maternal aunt - Anesthesia caused onset of dementia  . GERD (gastroesophageal reflux disease)     Past Surgical History:  Procedure Laterality Date  . COLONOSCOPY WITH PROPOFOL N/A 11/25/2016   Procedure: COLONOSCOPY WITH PROPOFOL;  Surgeon: Midge MiniumWohl, Darren, MD;  [facility];  Service: Endoscopy;  Laterality: N/A;  . SALPINGECTOMY  2010    Family History  Problem Relation Age of Onset  . Breast cancer Cousin        mat cousin  . Diabetes Father   . Diabetes Maternal Grandfather   . Hyperlipidemia Maternal Grandfather   . Diabetes Paternal Grandfather   . Hyperlipidemia Paternal Grandfather     Social History   Tobacco Use  . Smoking status: Never Smoker  . Smokeless tobacco: Never Used  Substance Use Topics  .  Alcohol use: No  . Drug use: No    No current facility-administered medications for this encounter.   Current Outpatient Medications:  Marland Kitchen.  Vitamin D, Ergocalciferol, (DRISDOL) 50000 units CAPS capsule, Take 1 capsule (50,000 Units total) by mouth every 7 (seven) days., Disp: 30 capsule, Rfl: 4 .  XIIDRA 5 % SOLN, INSTILL 1 DROP OU BID, Disp: , Rfl: 6 .  nitrofurantoin, macrocrystal-monohydrate, (MACROBID) 100 MG capsule, Take 1 capsule (100 mg total) by mouth 2 (two) times daily. X 5 days, Disp: 10 capsule, Rfl: 0 .  phenazopyridine (PYRIDIUM) 200 MG tablet, Take 1 tablet (200 mg total) by mouth 3 (three) times daily as needed for pain., Disp: 6 tablet, Rfl: 0  Allergies  Allergen Reactions  . Fish Allergy Anaphylaxis    Freshwater fish cause asthma symptoms. No problem with saltwater fish  . Penicillins      ROS  As noted in HPI.   Physical Exam  BP (!) 104/55 (BP Location: Left Arm)   Pulse 65   Temp 98.3 F (36.8 C) (Oral)   Resp 14   Ht 5\' 4"  (1.626 m)   Wt 134 lb (60.8 kg)   LMP 03/25/2017 (Exact Date)   SpO2 100%   BMI 23.00 kg/m   Constitutional: Well developed, well nourished, no acute distress Eyes:  EOMI, conjunctiva normal bilaterally HENT: Normocephalic, atraumatic,mucus membranes moist Respiratory: Normal inspiratory effort Cardiovascular: Normal rate GI: nondistended positive suprapubic tenderness.  No flank  tenderness. Back: No CVAT skin: No rash, skin intact Musculoskeletal: no deformities Neurologic: Alert & oriented x 3, no focal neuro deficits Psychiatric: Speech and behavior appropriate   ED Course   Medications - No data to display  Orders Placed This Encounter  Procedures  . Urine culture    Standing Status:   Standing    Number of Occurrences:   1    Order Specific Question:   List patient's active antibiotics    Answer:   macrobid    Order Specific Question:   Patient immune status    Answer:   Normal  . Urinalysis, Complete w  Microscopic    Standing Status:   Standing    Number of Occurrences:   1    Results for orders placed or performed during the hospital encounter of 04/14/17 (from the past 24 hour(s))  Urinalysis, Complete w Microscopic     Status: Abnormal   Collection Time: 04/14/17  5:54 PM  Result Value Ref Range   Color, Urine YELLOW YELLOW   APPearance CLOUDY (A) CLEAR   Specific Gravity, Urine 1.025 1.005 - 1.030   pH 5.5 5.0 - 8.0   Glucose, UA NEGATIVE NEGATIVE mg/dL   Hgb urine dipstick SMALL (A) NEGATIVE   Bilirubin Urine NEGATIVE NEGATIVE   Ketones, ur NEGATIVE NEGATIVE mg/dL   Protein, ur NEGATIVE NEGATIVE mg/dL   Nitrite POSITIVE (A) NEGATIVE   Leukocytes, UA LARGE (A) NEGATIVE   Squamous Epithelial / LPF 0-5 (A) NONE SEEN   WBC, UA TOO NUMEROUS TO COUNT 0 - 5 WBC/hpf   RBC / HPF 6-30 0 - 5 RBC/hpf   Bacteria, UA MANY (A) NONE SEEN   No results found.  ED Clinical Impression  Acute cystitis with hematuria   ED Assessment/Plan  UA and presentation consistent with UTI.  Will send home with Macrobid, Pyridium, push fluids.  Sending urine off for culture to confirm antibiotic choice.  Advised her to give [place] a working phone number so we can change her antibiotics if necessary.  Tylenol, ibuprofen as needed for pain.  Will provide a primary care referral list for routine care as she states that she does not have a primary care physician.    Discussed labs, MDM, treatment plan, and plan for follow-up with patient. Discussed sn/sx that should prompt return to the ED. patient agrees with plan.   Meds ordered this encounter  Medications  . phenazopyridine (PYRIDIUM) 200 MG tablet    Sig: Take 1 tablet (200 mg total) by mouth 3 (three) times daily as needed for pain.    Dispense:  6 tablet    Refill:  0  . nitrofurantoin, macrocrystal-monohydrate, (MACROBID) 100 MG capsule    Sig: Take 1 capsule (100 mg total) by mouth 2 (two) times daily. X 5 days    Dispense:  10 capsule    Refill:   0    *This clinic note was created using Scientist, clinical (histocompatibility and immunogenetics). Therefore, there may be occasional mistakes despite careful proofreading.   ?    Domenick Gong, MD 04/14/17 Rickey Primus

## 2017-04-17 LAB — URINE CULTURE
Culture: >=100000 — AB
SPECIAL REQUESTS: NORMAL

## 2017-08-18 ENCOUNTER — Other Ambulatory Visit: Payer: Self-pay | Admitting: Obstetrics and Gynecology

## 2017-08-31 ENCOUNTER — Other Ambulatory Visit: Payer: Self-pay

## 2017-08-31 ENCOUNTER — Encounter: Payer: Self-pay | Admitting: Gynecology

## 2017-08-31 ENCOUNTER — Ambulatory Visit
Admission: EM | Admit: 2017-08-31 | Discharge: 2017-08-31 | Disposition: A | Discharge status: Home / Self Care | Payer: BLUE CROSS/BLUE SHIELD | Attending: Family Medicine | Admitting: Family Medicine

## 2017-08-31 DIAGNOSIS — N3000 Acute cystitis without hematuria: Secondary | ICD-10-CM

## 2017-08-31 LAB — URINALYSIS, COMPLETE (UACMP) WITH MICROSCOPIC
BILIRUBIN URINE: NEGATIVE
GLUCOSE, UA: NEGATIVE mg/dL
KETONES UR: NEGATIVE mg/dL
Nitrite: NEGATIVE
PROTEIN: NEGATIVE mg/dL
SQUAMOUS EPITHELIAL / LPF: NONE SEEN (ref 0–5)
Specific Gravity, Urine: 1.015 (ref 1.005–1.030)
pH: 7 (ref 5.0–8.0)

## 2017-08-31 MED ORDER — SULFAMETHOXAZOLE-TRIMETHOPRIM 800-160 MG PO TABS: 1.0000 | ORAL_TABLET | Freq: Two times a day (BID) | ORAL | 0 refills | Status: AC

## 2017-08-31 NOTE — ED Triage Notes (Signed)
Patient c/o painful urination x last pm

## 2017-08-31 NOTE — ED Provider Notes (Signed)
[facility]    CSN: [id number] Arrival date & time: 08/31/17  1916  History   Chief Complaint Chief Complaint  Patient presents with  . Urinary Tract Infection   HPI  52 year old female presents with dysuria.  Patient reports that she developed dysuria last night.  3/10 in severity. She denies urinary frequency and urgency.  She states that she is thirsty all the time.  No weight loss.  No fevers or chills.  No abdominal pain.  No known inciting factor.  No exacerbating or alleviating factors.  No other associated symptoms.  No other complaints.  Past Medical History:  Diagnosis Date  . Childhood asthma   . Dry eyes   . GERD (gastroesophageal reflux disease)     Patient Active Problem List   Diagnosis Date Noted  . Special screening for malignant neoplasms, colon   . 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 Dry eye syndrome 07/08/2011  . Myopia of both eyes 07/08/2011  . Presbyopia 07/08/2011    Past Surgical History:  Procedure Laterality Date  . COLONOSCOPY WITH PROPOFOL N/A 11/25/2016   Procedure: COLONOSCOPY WITH PROPOFOL;  Surgeon: Midge Minium, MD;  Location: [facility];  Service: Endoscopy;  Laterality: N/A;  . SALPINGECTOMY  2010    OB History   None      Home Medications    Prior to Admission medications   Medication Sig Start Date End Date Taking? Authorizing Provider  Vitamin D, Ergocalciferol, (DRISDOL) 50000 units CAPS capsule Take 1 capsule (50,000 Units total) by mouth every 7 (seven) days. 09/20/16  Yes Shambley, Melody N, CNM  XIIDRA 5 % SOLN INSTILL 1 DROP OU BID 04/06/15  Yes [provider]  nitrofurantoin, macrocrystal-monohydrate, (MACROBID) 100 MG capsule Take 1 capsule (100 mg total) by mouth 2 (two) times daily. X 5 days 04/14/17   Domenick Gong, MD  phenazopyridine (PYRIDIUM) 200 MG tablet Take 1 tablet (200 mg total) by mouth 3 (three) times daily as needed for pain. 04/14/17   Domenick Gong, MD    sulfamethoxazole-trimethoprim (BACTRIM DS,SEPTRA DS) 800-160 MG tablet Take 1 tablet by mouth 2 (two) times daily for 7 days. 08/31/17 09/07/17  Tommie Sams, DO    Family History Family History  Problem Relation Age of Onset  . Breast cancer Cousin        mat cousin  . Diabetes Father   . Diabetes Maternal Grandfather   . Hyperlipidemia Maternal Grandfather   . Diabetes Paternal Grandfather   . Hyperlipidemia Paternal Grandfather     Social History Social History   Tobacco Use  . Smoking status: Never Smoker  . Smokeless tobacco: Never Used  Substance Use Topics  . Alcohol use: No  . Drug use: No     Allergies   Fish allergy and Penicillins   Review of Systems Review of Systems  Constitutional: Negative.   Gastrointestinal: Negative.   Genitourinary: Positive for dysuria.   Physical Exam Triage Vital Signs ED Triage Vitals  Enc Vitals Group     BP 08/31/17 1927 105/67     Pulse Rate 08/31/17 1927 63     Resp 08/31/17 1927 16     Temp 08/31/17 1927 98.1 F (36.7 C)     Temp Source 08/31/17 1927 Oral     SpO2 08/31/17 1927 100 %     Weight 08/31/17 1929 134 lb (60.8 kg)     Height 08/31/17 1929 5\' 4"  (1.626 m)     Head Circumference --  Peak Flow --      Pain Score 08/31/17 1925 3     Pain Loc --      Pain Edu? --      Excl. in GC? --    Updated Vital Signs BP 105/67 (BP Location: Left Arm)   Pulse 63   Temp 98.1 F (36.7 C) (Oral)   Resp 16   Ht 5\' 4"  (1.626 m)   Wt 60.8 kg   LMP 08/17/2017   SpO2 100%   BMI 23.00 kg/m   Visual Acuity Right Eye Distance:   Left Eye Distance:   Bilateral Distance:    Right Eye Near:   Left Eye Near:    Bilateral Near:     Physical Exam  Constitutional: She appears well-developed. No distress.  Cardiovascular: Normal rate and regular rhythm.  Pulmonary/Chest: Effort normal and breath sounds normal. She has no wheezes. She has no rales.  Abdominal: Soft. She exhibits no distension. There is no  tenderness.  Neurological: She is alert.  Psychiatric: She has a normal mood and affect. Her behavior is normal.  Nursing note and vitals reviewed.  UC Treatments / Results  Labs (all labs ordered are listed, but only abnormal results are displayed) Labs Reviewed  URINALYSIS, COMPLETE (UACMP) WITH MICROSCOPIC - Abnormal; Notable for the following components:      Result Value   Color, Urine STRAW (*)    Hgb urine dipstick SMALL (*)    Leukocytes, UA LARGE (*)    Bacteria, UA FEW (*)    All other components within normal limits  URINE CULTURE    EKG None  Radiology No results found.  Procedures Procedures (including critical care time)  Medications Ordered in UC Medications - No data to display  Initial Impression / Assessment and Plan / UC Course  I have reviewed the triage vital signs and the nursing notes.  Pertinent labs & imaging results that were available during my care of the patient were reviewed by me and considered in my medical decision making (see chart for details).    52 year old female presents with UTI.  Treated with Bactrim.  Sending culture.   Final Clinical Impressions(s) / UC Diagnoses   Final diagnoses:  Acute cystitis without hematuria   Discharge Instructions   None    ED Prescriptions    Medication Sig Dispense Auth. Provider   sulfamethoxazole-trimethoprim (BACTRIM DS,SEPTRA DS) 800-160 MG tablet Take 1 tablet by mouth 2 (two) times daily for 7 days. 14 tablet Tommie Samsook, Irisa Grimsley G, DO     Controlled Substance Prescriptions [place] Controlled Substance Registry consulted? Not Applicable   Tommie SamsCook, Calea Hribar G, DO 08/31/17 2004

## 2017-09-03 LAB — URINE CULTURE: Culture: >=100000 — AB

## 2017-09-06 ENCOUNTER — Other Ambulatory Visit: Payer: Self-pay | Admitting: Obstetrics and Gynecology

## 2017-09-06 DIAGNOSIS — Z1231 Encounter for screening mammogram for malignant neoplasm of breast: Secondary | ICD-10-CM

## 2017-09-22 ENCOUNTER — Encounter: Payer: Managed Care, Other (non HMO) | Admitting: Obstetrics and Gynecology

## 2017-10-26 ENCOUNTER — Encounter: Payer: Managed Care, Other (non HMO) | Admitting: Obstetrics and Gynecology

## 2017-11-23 ENCOUNTER — Ambulatory Visit
Admission: RE | Admit: 2017-11-23 | Discharge: 2017-11-23 | Disposition: A | Discharge status: Home / Self Care | Payer: BLUE CROSS/BLUE SHIELD | Source: Ambulatory Visit | Attending: Obstetrics and Gynecology | Admitting: Obstetrics and Gynecology

## 2017-11-23 ENCOUNTER — Encounter: Payer: Self-pay | Admitting: Obstetrics and Gynecology

## 2017-11-23 ENCOUNTER — Ambulatory Visit (INDEPENDENT_AMBULATORY_CARE_PROVIDER_SITE_OTHER): Payer: BLUE CROSS/BLUE SHIELD | Admitting: Obstetrics and Gynecology

## 2017-11-23 ENCOUNTER — Telehealth: Payer: Self-pay | Admitting: Obstetrics and Gynecology

## 2017-11-23 ENCOUNTER — Telehealth: Payer: Self-pay

## 2017-11-23 VITALS — BP 106/63 | HR 61 | Ht 64.0 in | Wt 137.4 lb

## 2017-11-23 DIAGNOSIS — Z01419 Encounter for gynecological examination (general) (routine) without abnormal findings: Secondary | ICD-10-CM

## 2017-11-23 DIAGNOSIS — Z1231 Encounter for screening mammogram for malignant neoplasm of breast: Secondary | ICD-10-CM

## 2017-11-23 DIAGNOSIS — E785 Hyperlipidemia, unspecified: Secondary | ICD-10-CM

## 2017-11-23 MED ORDER — VITAMIN D (ERGOCALCIFEROL) 1.25 MG (50000 UNIT) PO CAPS: 50000.0000 [IU] | ORAL_CAPSULE | ORAL | 4 refills | Status: DC

## 2017-11-23 NOTE — Telephone Encounter (Signed)
The patient called and stated that she forgot to ask a question at her apt, She wanted to know if she needs to keep taking the Vitamin D tablets. No other information was disclosed. Please advise.

## 2017-11-23 NOTE — Progress Notes (Signed)
Pt is here for an annual. Refused flu shot.

## 2017-11-23 NOTE — Telephone Encounter (Signed)
Per MS yes continue taking Vit D. [facility] [place]

## 2017-11-23 NOTE — Progress Notes (Signed)
Subjective:   Kelly Fleming is a 52 y.o. [id number] Caucasian female here for a routine well-woman exam.  Patient's last menstrual period was 11/10/2017 (exact date).    Current complaints: none, menses occurring monthly with normal flow.  PCP: me       does desire labs  Social History: Sexual: heterosexual Marital Status: married Living situation: with family Occupation: homemaker Tobacco/alcohol: no tobacco use Illicit drugs: no history of illicit drug use  The following portions of the patient's history were reviewed and updated as appropriate: allergies, current medications, past family history, past medical history, past social history, past surgical history and problem list.  Past Medical History Past Medical History:  Diagnosis Date  . Childhood asthma   . Dry eyes   . GERD (gastroesophageal reflux disease)     Past Surgical History Past Surgical History:  Procedure Laterality Date  . COLONOSCOPY WITH PROPOFOL N/A 11/25/2016   Procedure: COLONOSCOPY WITH PROPOFOL;  Surgeon: Kelly MiniumWohl, Darren, MD;  [facility];  Service: Endoscopy;  Laterality: N/A;  . SALPINGECTOMY  2010    Gynecologic History [id number]  Patient's last menstrual period was 11/10/2017 (exact date). Contraception: tubal ligation Last Pap: 2018. Results were: normal Last mammogram: 11/2016. Results were: normal   Obstetric History OB History  Gravida Para Term Preterm AB Living  3 1 1  0 1 1  SAB TAB Ectopic Multiple Live Births  1 0 0 0 1    # Outcome Date GA Lbr Len/2nd Weight Sex Delivery Anes PTL Lv  3 SAB 2006          2 Term 2004    M Vag-Spont   LIV  1 Gravida             Current Medications Current Outpatient Medications on File Prior to Visit  Medication Sig Dispense Refill  . Vitamin D, Ergocalciferol, (DRISDOL) 50000 units CAPS capsule Take 1 capsule (50,000 Units total) by mouth every 7 (seven) days. 30 capsule 4  . XIIDRA 5 % SOLN INSTILL 1 DROP OU BID  6   No  current facility-administered medications on file prior to visit.     Review of Systems Patient denies any headaches, blurred vision, shortness of breath, chest pain, abdominal pain, problems with bowel movements, urination, or intercourse.  Objective:  BP 106/63   Pulse 61   Ht 5\' 4"  (1.626 m)   Wt 137 lb 7 oz (62.3 kg)   LMP 11/10/2017 (Exact Date)   BMI 23.59 kg/m  Physical Exam  General:  Well developed, well nourished, no acute distress. She is alert and oriented x3. Skin:  Warm and dry Neck:  Midline trachea, no thyromegaly or nodules Cardiovascular: Regular rate and rhythm, no murmur heard Lungs:  Effort normal, all lung fields clear to auscultation bilaterally Breasts:  No dominant palpable mass, retraction, or nipple discharge Abdomen:  Soft, non tender, no hepatosplenomegaly or masses Pelvic:  External genitalia is normal in appearance.  The vagina is normal in appearance. The cervix is bulbous, no CMT.  Thin prep pap is not done . Uterus is felt to be normal size, shape, and contour.  No adnexal masses or tenderness noted. Extremities:  No swelling or varicosities noted Psych:  She has a normal mood and affect  Assessment:   Healthy well-woman exam Elevated cholesterol. Needs flu vaccine  Plan:  Labs obtained- will follow up accordingly. Declines flu vaccineF/U 1 year for AE, or sooner if needed Mammogram done today  Kelly Fleming Kelly NailerN Kelly Fleming, CNM

## 2017-11-24 LAB — COMPREHENSIVE METABOLIC PANEL
A/G RATIO: 1.8 (ref 1.2–2.2)
ALBUMIN: 4.8 g/dL (ref 3.5–5.5)
ALK PHOS: 50 IU/L (ref 39–117)
ALT: 14 IU/L (ref 0–32)
AST: 23 IU/L (ref 0–40)
BUN / CREAT RATIO: 14 (ref 9–23)
BUN: 10 mg/dL (ref 6–24)
Bilirubin Total: 0.4 mg/dL (ref 0.0–1.2)
CO2: 24 mmol/L (ref 20–29)
Calcium: 9.9 mg/dL (ref 8.7–10.2)
Chloride: 101 mmol/L (ref 96–106)
Creatinine, Ser: 0.69 mg/dL (ref 0.57–1.00)
GFR calc Af Amer: 117 mL/min/{1.73_m2} (ref >59)
GFR, EST NON AFRICAN AMERICAN: 101 mL/min/{1.73_m2} (ref >59)
GLOBULIN, TOTAL: 2.6 g/dL (ref 1.5–4.5)
Glucose: 81 mg/dL (ref 65–99)
POTASSIUM: 4.7 mmol/L (ref 3.5–5.2)
SODIUM: 141 mmol/L (ref 134–144)
Total Protein: 7.4 g/dL (ref 6.0–8.5)

## 2017-11-24 LAB — LIPID PANEL
Chol/HDL Ratio: 3.9 ratio (ref 0.0–4.4)
Cholesterol, Total: 232 mg/dL — ABNORMAL HIGH (ref 100–199)
HDL: 59 mg/dL (ref >39)
LDL CALC: 155 mg/dL — AB (ref 0–99)
TRIGLYCERIDES: 91 mg/dL (ref 0–149)
VLDL CHOLESTEROL CAL: 18 mg/dL (ref 5–40)

## 2017-11-24 LAB — TSH: TSH: 2.56 u[IU]/mL (ref 0.450–4.500)

## 2017-11-24 LAB — HEMOGLOBIN A1C
Est. average glucose Bld gHb Est-mCnc: 97 mg/dL
Hgb A1c MFr Bld: 5 % (ref 4.8–5.6)

## 2017-11-25 ENCOUNTER — Other Ambulatory Visit: Payer: Self-pay | Admitting: Obstetrics and Gynecology

## 2017-11-29 ENCOUNTER — Telehealth: Payer: Self-pay | Admitting: *Deleted

## 2017-11-29 NOTE — Telephone Encounter (Signed)
-----   Message from Purcell NailsMelody N Shambley, [place] sent at 11/24/2017 11:00 AM EST ----- Please let her know labs are fine except cholesterol, please mail info for her to review and tell her to work on reducing daily intake of high cholesterol foods. We will recheck next year.

## 2017-11-29 NOTE — Telephone Encounter (Signed)
Called pt and notified of all results

## 2017-11-29 NOTE — Telephone Encounter (Signed)
See other message

## 2017-12-05 ENCOUNTER — Other Ambulatory Visit: Payer: Self-pay | Admitting: *Deleted

## 2017-12-05 ENCOUNTER — Telehealth: Payer: Self-pay | Admitting: Obstetrics and Gynecology

## 2017-12-05 MED ORDER — VITAMIN D (ERGOCALCIFEROL) 1.25 MG (50000 UNIT) PO CAPS: ORAL_CAPSULE | ORAL | 0 refills | Status: DC

## 2017-12-05 NOTE — Telephone Encounter (Signed)
The patient called and stated that she would like to have her vitamin d prescription changed to a 3 month supply. Please advise.

## 2017-12-05 NOTE — Telephone Encounter (Signed)
Done-ac 

## 2018-01-19 ENCOUNTER — Other Ambulatory Visit: Payer: Self-pay | Admitting: *Deleted

## 2018-01-19 ENCOUNTER — Telehealth: Payer: Self-pay | Admitting: Obstetrics and Gynecology

## 2018-01-19 MED ORDER — VITAMIN D (ERGOCALCIFEROL) 1.25 MG (50000 UNIT) PO CAPS: ORAL_CAPSULE | ORAL | 6 refills | Status: DC

## 2018-01-19 NOTE — Telephone Encounter (Signed)
The patient is asking to up her vitD script to a 3 mo supply instead of a month at a time, and to send a refill to her pharmacy at [facility] in [place].  Please advise, thanks.

## 2018-01-19 NOTE — Telephone Encounter (Signed)
Done-ac 

## 2018-06-07 ENCOUNTER — Other Ambulatory Visit: Payer: Self-pay | Admitting: Obstetrics and Gynecology

## 2018-06-12 ENCOUNTER — Other Ambulatory Visit: Payer: Self-pay | Admitting: Obstetrics and Gynecology

## 2018-06-12 DIAGNOSIS — Z1231 Encounter for screening mammogram for malignant neoplasm of breast: Secondary | ICD-10-CM

## 2018-07-26 ENCOUNTER — Telehealth: Payer: Self-pay | Admitting: Obstetrics and Gynecology

## 2018-07-26 NOTE — Telephone Encounter (Signed)
The Patient called and stated that she needs to speak with Amy or Melody in regards to her having two menstrual cycles this month. The patient expressed this has never happened before and that she has some concerns. Please advise.

## 2018-07-27 ENCOUNTER — Telehealth: Payer: Self-pay | Admitting: Obstetrics and Gynecology

## 2018-07-27 NOTE — Telephone Encounter (Signed)
Left pt detailed message she needs to make appt

## 2018-07-27 NOTE — Telephone Encounter (Signed)
The patient called and stated that she missed a call from Melody. The patient is requesting a call back, please advise.

## 2018-07-27 NOTE — Telephone Encounter (Signed)
Please disregard message. Pt called back after listening to her voicemail. Thank you.

## 2018-08-01 ENCOUNTER — Encounter: Payer: BLUE CROSS/BLUE SHIELD | Admitting: Certified Nurse Midwife

## 2018-08-10 ENCOUNTER — Encounter: Payer: Self-pay | Admitting: Certified Nurse Midwife

## 2018-08-10 ENCOUNTER — Ambulatory Visit: Payer: BLUE CROSS/BLUE SHIELD | Admitting: Certified Nurse Midwife

## 2018-08-10 ENCOUNTER — Other Ambulatory Visit: Payer: Self-pay

## 2018-08-10 VITALS — BP 105/57 | HR 63 | Ht 64.0 in | Wt 140.1 lb

## 2018-08-10 DIAGNOSIS — N926 Irregular menstruation, unspecified: Secondary | ICD-10-CM | POA: Diagnosis not present

## 2018-08-10 NOTE — Patient Instructions (Signed)

## 2018-08-10 NOTE — Progress Notes (Signed)
GYN ENCOUNTER NOTE  Subjective:       Kelly Fleming is a 53 y.o. [phone] female is here for gynecologic evaluation of the following issues:  1. 2 periods this month. She states they were not heavy nor significantly painful. They lasted for a few days.    Gynecologic History No LMP recorded. Contraception: none Last Pap: 09/20/16. Results were: normal, HPV neg.  Last mammogram: 11/23/17. Results were: normal  Obstetric History OB History  Gravida Para Term Preterm AB Living  4 1 1  0 3 1  SAB TAB Ectopic Multiple Live Births  2 0 1 0 1    # Outcome Date GA Lbr Len/2nd Weight Sex Delivery Anes PTL Lv  4 Ectopic 2010          3 SAB 2008          2 SAB 2006          1 Term 2004    M Vag-Spont   LIV    Past Medical History:  Diagnosis Date  . Childhood asthma   . Dry eyes   . GERD (gastroesophageal reflux disease)     Past Surgical History:  Procedure Laterality Date  . COLONOSCOPY WITH PROPOFOL N/A 11/25/2016   Procedure: COLONOSCOPY WITH PROPOFOL;  Surgeon: Midge MiniumWohl, Darren, MD;  [facility];  Service: Endoscopy;  Laterality: N/A;  . SALPINGECTOMY  2010    Current Outpatient Medications on File Prior to Visit  Medication Sig Dispense Refill  . Vitamin D, Ergocalciferol, (DRISDOL) 1.25 MG (50000 UT) CAPS capsule TAKE 1 CAPSULE BY MOUTH EVERY 7 DAYS 12 capsule 6  . XIIDRA 5 % SOLN INSTILL 1 DROP OU BID  6   No current facility-administered medications on file prior to visit.     Allergies  Allergen Reactions  . Fish Allergy Anaphylaxis    Freshwater fish cause asthma symptoms. No problem with saltwater fish  . Penicillins   . Sulfa Antibiotics     Social History   Socioeconomic History  . Marital status: Married    Spouse name: Not on file  . Number of children: Not on file  . Years of education: Not on file  . Highest education level: Not on file  Occupational History  . Not on file  Social Needs  . Financial resource strain: Not on file   . Food insecurity    Worry: Not on file    Inability: Not on file  . Transportation needs    Medical: Not on file    Non-medical: Not on file  Tobacco Use  . Smoking status: Never Smoker  . Smokeless tobacco: Never Used  Substance and Sexual Activity  . Alcohol use: No  . Drug use: No  . Sexual activity: Yes    Birth control/protection: None  Lifestyle  . Physical activity    Days per week: Not on file    Minutes per session: Not on file  . Stress: Not on file  Relationships  . Social Musicianconnections    Talks on phone: Not on file    Gets together: Not on file    Attends religious service: Not on file    Active member of club or organization: Not on file    Attends meetings of clubs or organizations: Not on file    Relationship status: Not on file  . Intimate partner violence    Fear of current or ex partner: Not on file    Emotionally abused: Not on file  Physically abused: Not on file    Forced sexual activity: Not on file  Other Topics Concern  . Not on file  Social History Narrative  . Not on file    Family History  Problem Relation Age of Onset  . Breast cancer Cousin        mat cousin  . Diabetes Father   . Diabetes Maternal Grandfather   . Hyperlipidemia Maternal Grandfather   . Diabetes Paternal Grandfather   . Hyperlipidemia Paternal Grandfather     The following portions of the patient's history were reviewed and updated as appropriate: allergies, current medications, past family history, past medical history, past social history, past surgical history and problem list.  Review of Systems Review of Systems - Negative except as mentioned in HPI Review of Systems - General ROS: negative for - chills, fatigue, fever, hot flashes, malaise or night sweats Hematological and Lymphatic ROS: negative for - bleeding problems or swollen lymph nodes Gastrointestinal ROS: negative for - abdominal pain, blood in stools, change in bowel habits and  nausea/vomiting Musculoskeletal ROS: negative for - joint pain, muscle pain or muscular weakness Genito-Urinary ROS: negative for - change in menstrual cycle, dysmenorrhea, dyspareunia, dysuria, genital discharge, genital ulcers, hematuria, incontinence, irregular/heavy menses, nocturia or pelvic painjj  Objective:   There were no vitals taken for this visit. CONSTITUTIONAL: Well-developed, well-nourished female in no acute distress.  HENT:  Normocephalic, atraumatic.  NECK: Normal range of motion, supple, no masses.  Normal thyroid.  SKIN: Skin is warm and dry. No rash noted. Not diaphoretic. No erythema. No pallor. [place]: Alert and oriented to person, place, and time. PSYCHIATRIC: Normal mood and affect. Normal behavior. Normal judgment and thought content. CARDIOVASCULAR:Not Examined RESPIRATORY: Not Examined BREASTS: Not Examined ABDOMEN: Soft, non distended; Non tender.  No Organomegaly. PELVIC: deferred  MUSCULOSKELETAL: Normal range of motion. No tenderness.  No cyanosis, clubbing, or edema.   Assessment:   Irregular menses.    Plan:   Reviewed menopausal changes and timing. She admits to having vaginal dryness. Discussed irregularity during perimenopausal period. Discussed signs and symptoms and hormonal treatment options. She declines medications at this time. Discussed blood work for testing as being predictive but not diagnostic  She verbalizes understanding. She will follow up if she decides to do hormonal treatment or as scheduled for annual with Melody.   I attest more than 50% of visit spent reviewing history, discussing menopause and changes, discussing treatment options. Face to face time 10 min.   Philip Aspen, CNM

## 2018-11-29 ENCOUNTER — Other Ambulatory Visit: Payer: Self-pay

## 2018-11-29 ENCOUNTER — Encounter: Payer: Self-pay | Admitting: Obstetrics and Gynecology

## 2018-11-29 ENCOUNTER — Encounter: Payer: BLUE CROSS/BLUE SHIELD | Admitting: Obstetrics and Gynecology

## 2018-11-29 ENCOUNTER — Ambulatory Visit (INDEPENDENT_AMBULATORY_CARE_PROVIDER_SITE_OTHER): Payer: BLUE CROSS/BLUE SHIELD | Admitting: Obstetrics and Gynecology

## 2018-11-29 ENCOUNTER — Ambulatory Visit
Admission: RE | Admit: 2018-11-29 | Discharge: 2018-11-29 | Disposition: A | Discharge status: Home / Self Care | Payer: BLUE CROSS/BLUE SHIELD | Source: Ambulatory Visit | Attending: Obstetrics and Gynecology | Admitting: Obstetrics and Gynecology

## 2018-11-29 VITALS — BP 112/73 | HR 73 | Ht 64.0 in | Wt 140.1 lb

## 2018-11-29 DIAGNOSIS — Z1231 Encounter for screening mammogram for malignant neoplasm of breast: Secondary | ICD-10-CM | POA: Diagnosis not present

## 2018-11-29 DIAGNOSIS — Z01419 Encounter for gynecological examination (general) (routine) without abnormal findings: Secondary | ICD-10-CM | POA: Diagnosis not present

## 2018-11-29 NOTE — Progress Notes (Signed)
Patient comes in today for her annual exam.

## 2018-11-29 NOTE — Progress Notes (Signed)
HPI:      Kelly Fleming is a 53 y.o. [phone] who LMP was Patient's last menstrual period was 11/08/2018.  Subjective:   She presents today for her annual examination.  She continues to experience normal menstrual cycles.  She has no complaints today.  She has a mammogram scheduled for later today.    Hx: The following portions of the patient's history were reviewed and updated as appropriate:             She  has a past medical history of Childhood asthma, Dry eyes, and GERD (gastroesophageal reflux disease). She does not have any pertinent problems on file. She  has a past surgical history that includes Salpingectomy (2010) and Colonoscopy with propofol (N/A, 11/25/2016). Her family history includes Breast cancer in her cousin; Diabetes in her father, maternal grandfather, and paternal grandfather; Hyperlipidemia in her maternal grandfather and paternal grandfather. She  reports that she has never smoked. She has never used smokeless tobacco. She reports that she does not drink alcohol or use drugs. She has a current medication list which includes the following prescription(s): vitamin d (ergocalciferol) and xiidra. She is allergic to fish allergy; penicillins; and sulfa antibiotics.       Review of Systems:  Review of Systems  Constitutional: Denied constitutional symptoms, night sweats, recent illness, fatigue, fever, insomnia and weight loss.  Eyes: Denied eye symptoms, eye pain, photophobia, vision change and visual disturbance.  Ears/Nose/Throat/Neck: Denied ear, nose, throat or neck symptoms, hearing loss, nasal discharge, sinus congestion and sore throat.  Cardiovascular: Denied cardiovascular symptoms, arrhythmia, chest pain/pressure, edema, exercise intolerance, orthopnea and palpitations.  Respiratory: Denied pulmonary symptoms, asthma, pleuritic pain, productive sputum, cough, dyspnea and wheezing.  Gastrointestinal: Denied, gastro-esophageal reflux, melena, nausea and  vomiting.  Genitourinary: Denied genitourinary symptoms including symptomatic vaginal discharge, pelvic relaxation issues, and urinary complaints.  Musculoskeletal: Denied musculoskeletal symptoms, stiffness, swelling, muscle weakness and myalgia.  Dermatologic: Denied dermatology symptoms, rash and scar.  Neurologic: Denied neurology symptoms, dizziness, headache, neck pain and syncope.  Psychiatric: Denied psychiatric symptoms, anxiety and depression.  Endocrine: Denied endocrine symptoms including hot flashes and night sweats.   Meds:   Current Outpatient Medications on File Prior to Visit  Medication Sig Dispense Refill  . Vitamin D, Ergocalciferol, (DRISDOL) 1.25 MG (50000 UT) CAPS capsule TAKE 1 CAPSULE BY MOUTH EVERY 7 DAYS 12 capsule 6  . XIIDRA 5 % SOLN INSTILL 1 DROP OU BID  6   No current facility-administered medications on file prior to visit.     Objective:     Vitals:   11/29/18 1416  BP: 112/73  Pulse: 73              Physical examination General NAD, Conversant  HEENT Atraumatic; Op clear with mmm.  Normo-cephalic. Pupils reactive. Anicteric sclerae  Thyroid/Neck Smooth without nodularity or enlargement. Normal ROM.  Neck Supple.  Skin No rashes, lesions or ulceration. Normal palpated skin turgor. No nodularity.  Breasts: No masses or discharge.  Symmetric.  No axillary adenopathy.  Lungs: Clear to auscultation.No rales or wheezes. Normal Respiratory effort, no retractions.  Heart: NSR.  No murmurs or rubs appreciated. No periferal edema  Abdomen: Soft.  Non-tender.  No masses.  No HSM. No hernia  Extremities: Moves all appropriately.  Normal ROM for age. No lymphadenopathy.  Neuro: Oriented to PPT.  Normal mood. Normal affect.     Pelvic:   Vulva: Normal appearance.  No lesions.  Vagina: No lesions or abnormalities noted.  Support: Normal pelvic support.  Urethra No masses tenderness or scarring.  Meatus Normal size without lesions or prolapse.  Cervix:  Normal appearance.  No lesions.  Anus: Normal exam.  No lesions.  Perineum: Normal exam.  No lesions.        Bimanual   Uterus:  Top normal size non-tender.  Mobile.  AV.  Adnexae: No masses.  Non-tender to palpation.  Cul-de-sac: Negative for abnormality.      Assessment:    [id number] Patient Active Problem List   Diagnosis Date Noted  . Special screening for malignant neoplasms, colon   . 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 Dry eye syndrome 07/08/2011  . Myopia of both eyes 07/08/2011  . Presbyopia 07/08/2011     1. Encounter for gynecological examination without abnormal finding     No problems today-normal exam   Plan:            1.  Basic Screening Recommendations The basic screening recommendations for asymptomatic women were discussed with the patient during her visit.  The age-appropriate recommendations were discussed with her and the rational for the tests reviewed.  When I am informed by the patient that another primary care physician has previously obtained the age-appropriate tests and they are up-to-date, only outstanding tests are ordered and referrals given as necessary.  Abnormal results of tests will be discussed with her when all of her results are completed.  Routine preventative health maintenance measures emphasized: Exercise/Diet/Weight control, Tobacco Warnings, Alcohol/Substance use risks and Stress Management Mammogram later today-patient to return for routine blood work when fasting. Orders Orders Placed This Encounter  Procedures  . HgB A1c  . Lipid Profile  . TSH    No orders of the defined types were placed in this encounter.       F/U  Return in about 1 year (around 11/29/2019) for Annual Physical.  Elonda Husky, M.D. 11/29/2018 2:36 PM

## 2018-12-04 ENCOUNTER — Other Ambulatory Visit: Payer: BLUE CROSS/BLUE SHIELD

## 2018-12-04 ENCOUNTER — Other Ambulatory Visit: Payer: Self-pay

## 2018-12-05 LAB — LIPID PANEL
Chol/HDL Ratio: 3.9 ratio (ref 0.0–4.4)
Cholesterol, Total: 213 mg/dL — ABNORMAL HIGH (ref 100–199)
HDL: 54 mg/dL (ref >39)
LDL Chol Calc (NIH): 144 mg/dL — ABNORMAL HIGH (ref 0–99)
Triglycerides: 84 mg/dL (ref 0–149)
VLDL Cholesterol Cal: 15 mg/dL (ref 5–40)

## 2018-12-05 LAB — HEMOGLOBIN A1C
Est. average glucose Bld gHb Est-mCnc: 103 mg/dL
Hgb A1c MFr Bld: 5.2 % (ref 4.8–5.6)

## 2018-12-05 LAB — TSH: TSH: 2.84 u[IU]/mL (ref 0.450–4.500)

## 2018-12-12 ENCOUNTER — Telehealth: Payer: Self-pay | Admitting: Obstetrics and Gynecology

## 2018-12-12 NOTE — Telephone Encounter (Signed)
Pt returning call from nurse. Pt states she is busy and that leaving a detail message in okay. Please advise.

## 2018-12-13 NOTE — Telephone Encounter (Signed)
Left detailed message on patients phone.

## 2019-03-18 ENCOUNTER — Encounter (INDEPENDENT_AMBULATORY_CARE_PROVIDER_SITE_OTHER): Payer: Self-pay | Admitting: Ophthalmology

## 2019-03-19 NOTE — Progress Notes (Signed)
[facility] - Clinic Note  03/25/2019     CHIEF COMPLAINT Patient presents for Retina Evaluation   HISTORY OF PRESENT ILLNESS: Kelly Fleming is a 54 y.o. female who presents to the clinic today for:   HPI    Retina Evaluation    In right eye.  This started 2 weeks ago.  Duration of 2 weeks.  Associated Symptoms Floaters and Flashes.  Negative for Distortion, Pain, Photophobia, Trauma, Jaw Claudication, Fever, Fatigue, Blind Spot, Redness, Glare, Scalp Tenderness, Shoulder/Hip pain and Weight Loss.  Context:  distance vision, mid-range vision and near vision.  Treatments tried include no treatments.  I, the attending physician,  performed the HPI with the patient and updated documentation appropriately.          Comments    Patient referred for possible retinal hole OD. Patient c/o flashes and floaters OD for the past 2 weeks. No curtain/veil over vision. Vision seems good OU.       Last edited by Rennis Chris, MD on 03/25/2019  2:27 PM. (History)    pt states she saw Dr. Clydene Pugh a couple weeks ago bc she was seeing new floaters OD, she states they started out looking like strings and then turned into an "amoeba" with several tiny black dots in her superior vision, pt is taking xiidra bid and uses an ointment at night for dry eyes, pt denies being diabetic or hypertensive and states she does not take any medications on a regular basis  Referring physician: Isla Pence, OD [address] [place],  [place] [address]  HISTORICAL INFORMATION:   Selected notes from the MEDICAL RECORD NUMBER Referred by Dr. Clydene Pugh for concern of retinal hole OD BCVA: 20/20 20/20 LEE: 03/13/2019   CURRENT MEDICATIONS: Current Outpatient Medications (Ophthalmic Drugs)  Medication Sig  . XIIDRA 5 % SOLN INSTILL 1 DROP OU BID  . prednisoLONE acetate (PRED FORTE) 1 % ophthalmic suspension Place 1 drop into the right eye 4 (four) times daily for 7 days.   No current  facility-administered medications for this visit. (Ophthalmic Drugs)   Current Outpatient Medications (Other)  Medication Sig  . Vitamin D, Ergocalciferol, (DRISDOL) 1.25 MG (50000 UT) CAPS capsule TAKE 1 CAPSULE BY MOUTH EVERY 7 DAYS (Patient not taking: Reported on 03/25/2019)   No current facility-administered medications for this visit. (Other)      REVIEW OF SYSTEMS: ROS    Positive for: Eyes   Negative for: Constitutional, Gastrointestinal, Neurological, Skin, Genitourinary, Musculoskeletal, HENT, Endocrine, Cardiovascular, Respiratory, Psychiatric, Allergic/Imm, Heme/Lymph   Last edited by Annalee Genta D, COT on 03/25/2019  1:58 PM. (History)       ALLERGIES Allergies  Allergen Reactions  . Fish Allergy Anaphylaxis    Freshwater fish cause asthma symptoms. No problem with saltwater fish  . Penicillins   . Sulfa Antibiotics     PAST MEDICAL HISTORY Past Medical History:  Diagnosis Date  . Childhood asthma   . Dry eyes   . GERD (gastroesophageal reflux disease)    Past Surgical History:  Procedure Laterality Date  . COLONOSCOPY WITH PROPOFOL N/A 11/25/2016   Procedure: COLONOSCOPY WITH PROPOFOL;  Surgeon: Midge Minium, MD;  [facility];  Service: Endoscopy;  Laterality: N/A;  . SALPINGECTOMY  2010    FAMILY HISTORY Family History  Problem Relation Age of Onset  . Breast cancer Cousin        mat cousin  . Diabetes Father   . Diabetes Maternal Grandfather   .  Hyperlipidemia Maternal Grandfather   . Diabetes Paternal Grandfather   . Hyperlipidemia Paternal Grandfather   . Glaucoma Mother   . Retinal detachment Paternal Aunt   . Ovarian cancer Neg Hx   . Colon cancer Neg Hx     SOCIAL HISTORY Social History   Tobacco Use  . Smoking status: Never Smoker  . Smokeless tobacco: Never Used  Substance Use Topics  . Alcohol use: No  . Drug use: No         OPHTHALMIC EXAM:  Base Eye Exam    Visual Acuity (Snellen - Linear)       Right Left   Dist cc 20/20 -2 20/25 +2   Dist ph cc 20/20 20/20       Tonometry (Tonopen, 2:06 PM)      Right Left   Pressure 15 16       Pupils      Dark Light Shape React APD   Right 4 3 Round Brisk None   Left 4 3 Round Brisk None       Visual Fields (Counting fingers)      Left Right    Full Full       Extraocular Movement      Right Left    Full, Ortho Full, Ortho       Dilation    Both eyes: 1.0% Mydriacyl, 2.5% Phenylephrine @ 2:06 PM        Slit Lamp and Fundus Exam    Slit Lamp Exam      Right Left   Lids/Lashes Mild Meibomian gland dysfunction Mild Meibomian gland dysfunction   Conjunctiva/Sclera White and quiet White and quiet   Cornea 3+ Punctate epithelial erosions 3+ Punctate epithelial erosions   Anterior Chamber Deep and quiet Deep and quiet   Iris Round and dilated Round and dilated   Lens 1-2+ Nuclear sclerosis, 1-2+ Cortical cataract 1-2+ Nuclear sclerosis, 1-2+ Cortical cataract   Vitreous Vitreous syneresis, no pigment, Posterior vitreous detachment, Weiss ring Vitreous syneresis       Fundus Exam      Right Left   Disc Pink and Sharp Pink and Sharp, +cupping   C/D Ratio 0.6 0.65   Macula Flat, Blunted foveal reflex, mild Retinal pigment epithelial mottling, No heme or edema Flat, good foveal reflex, mild Retinal pigment epithelial mottling, No heme or edema   Vessels Mild Vascular attenuation Mild Vascular attenuation   Periphery Attached, large patch of pigmented lattice with atrophic holes from 1000-1130, no SRF  Attached, focal patch of pigmented lattice at 0500        Refraction    Wearing Rx      Sphere Cylinder Axis   Right -5.25 Sphere    Left -6.00 +1.00 110          IMAGING AND PROCEDURES  Imaging and Procedures for @TODAY @  OCT, Retina - OU - Both Eyes       Right Eye Quality was good. Central Foveal Thickness: 257. Progression has no prior data. Findings include no IRF, normal foveal contour, no SRF.   Left  Eye Quality was good. Central Foveal Thickness: 263. Progression has no prior data. Findings include normal foveal contour, no IRF, no SRF, vitreomacular adhesion .   Notes *Images captured and stored on drive  Diagnosis / Impression:  NFP, no IRF/SRF OU  Clinical management:  See below  Abbreviations: NFP - Normal foveal profile. CME - cystoid macular edema. PED - pigment epithelial detachment. IRF -  intraretinal fluid. SRF - subretinal fluid. EZ - ellipsoid zone. ERM - epiretinal membrane. ORA - outer retinal atrophy. ORT - outer retinal tubulation. SRHM - subretinal hyper-reflective material                 ASSESSMENT/PLAN:    ICD-10-CM   1. Bilateral retinal lattice degeneration  H35.413   2. 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 Retinal edema  H35.81 OCT, Retina - OU - Both Eyes  4. Combined forms of age-related cataract of both eyes  H25.813   5. Dry eyes  H04.123     1,2. Peripheral lattice degeneration OU w/ atrophic holes OD  - OD: large patch of pigmented lattice with atrophic holes from 1000-1130, no SRF  - OS: focal patch of pigmented lattice at 0500  - discussed findings, prognosis, and treatment options including observation  - recommend laser retinopexy OD today, 03.15.21  - unable to obtain pre-authorization from [facility]  - pt will return ASAP once prior-auth obtained  3. No retinal edema on exam or OCT  4. Mixed form age related cataracts OU  - The symptoms of cataract, surgical options, and treatments and risks were discussed with patient.  - discussed diagnosis and progression  - not yet visually significant  - monitor for now  5. Dry eyes OU  - managed by Dr. Clydene Pugh  - currently on Xiidra and lubricating gel/ointment at night    Ophthalmic Meds Ordered this visit:  Meds ordered this encounter  Medications  . prednisoLONE acetate (PRED FORTE) 1 % ophthalmic suspension    Sig: Place 1 drop into the right eye 4 (four) times daily for 7 days.     Dispense:  10 mL    Refill:  0       Return for ASAP for Laser retinopexy OD.  There are no Patient Instructions on file for this visit.   Explained the diagnoses, plan, and follow up with the patient and they expressed understanding.  Patient expressed understanding of the importance of proper follow up care.    This document serves as a record of services personally performed by Karie Chimera, MD, PhD. It was created on their behalf by Herby Abraham, COA, a certified ophthalmic assistant. The creation of this record is the provider's dictation and/or activities during the visit.    Electronically signed by: Herby Abraham, COA @TODAY @ 4:22 PM  , M.D., Ph.D. Diseases & Surgery of the Retina and Vitreous [facility]  I have reviewed the above documentation for accuracy and completeness, and I agree with the above. [facility], M.D., Ph.D. 03/25/19 4:22 PM   Abbreviations: M myopia (nearsighted); A astigmatism; H hyperopia (farsighted); P presbyopia; Mrx spectacle prescription;  CTL contact lenses; OD right eye; OS left eye; OU both eyes  XT exotropia; ET esotropia; PEK punctate epithelial keratitis; PEE punctate epithelial erosions; DES dry eye syndrome; MGD meibomian gland dysfunction; ATs artificial tears; PFAT's preservative free artificial tears; NSC nuclear sclerotic cataract; PSC posterior subcapsular cataract; ERM epi-retinal membrane; PVD posterior vitreous detachment; RD retinal detachment; DM diabetes mellitus; DR diabetic retinopathy; NPDR non-proliferative diabetic retinopathy; PDR proliferative diabetic retinopathy; CSME clinically significant macular edema; DME diabetic macular edema; dbh dot blot hemorrhages; CWS cotton wool spot; POAG primary open angle glaucoma; C/D cup-to-disc ratio; HVF humphrey visual field; GVF goldmann visual field; OCT optical coherence tomography; IOP intraocular pressure; BRVO Branch retinal vein  occlusion; CRVO central retinal vein occlusion;  CRAO central retinal artery occlusion; BRAO branch retinal artery occlusion; RT retinal tear; SB scleral buckle; PPV pars plana vitrectomy; VH Vitreous hemorrhage; PRP panretinal laser photocoagulation; IVK intravitreal kenalog; VMT vitreomacular traction; MH Macular hole;  NVD neovascularization of the disc; NVE neovascularization elsewhere; AREDS age related eye disease study; ARMD age related macular degeneration; POAG primary open angle glaucoma; EBMD epithelial/anterior basement membrane dystrophy; ACIOL anterior chamber intraocular lens; IOL intraocular lens; PCIOL posterior chamber intraocular lens; Phaco/IOL phacoemulsification with intraocular lens placement; Kiester photorefractive keratectomy; LASIK laser assisted in situ keratomileusis; HTN hypertension; DM diabetes mellitus; COPD chronic obstructive pulmonary disease

## 2019-03-25 ENCOUNTER — Ambulatory Visit (INDEPENDENT_AMBULATORY_CARE_PROVIDER_SITE_OTHER): Payer: BLUE CROSS/BLUE SHIELD | Admitting: Ophthalmology

## 2019-03-25 ENCOUNTER — Other Ambulatory Visit: Payer: Self-pay

## 2019-03-25 ENCOUNTER — Encounter (INDEPENDENT_AMBULATORY_CARE_PROVIDER_SITE_OTHER): Payer: Self-pay | Admitting: Ophthalmology

## 2019-03-25 DIAGNOSIS — H3581 Retinal edema: Secondary | ICD-10-CM

## 2019-03-25 DIAGNOSIS — H33321 Round hole, right eye: Secondary | ICD-10-CM

## 2019-03-25 DIAGNOSIS — H35413 Lattice degeneration of retina, bilateral: Secondary | ICD-10-CM | POA: Diagnosis not present

## 2019-03-25 DIAGNOSIS — H25813 Combined forms of age-related cataract, bilateral: Secondary | ICD-10-CM

## 2019-03-25 DIAGNOSIS — H04123 Dry eye syndrome of bilateral lacrimal glands: Secondary | ICD-10-CM

## 2019-03-25 MED ORDER — PREDNISOLONE ACETATE 1 % OP SUSP: 1.0000 [drp] | Freq: Four times a day (QID) | OPHTHALMIC | 0 refills | Status: DC

## 2019-03-29 NOTE — Progress Notes (Signed)
[facility] - Clinic Note  04/01/2019     CHIEF COMPLAINT Patient presents for Retina Follow Up   HISTORY OF PRESENT ILLNESS: Kelly Fleming is a 54 y.o. female who presents to the clinic today for:   HPI    Retina Follow Up    Patient presents with  Other.  In right eye.  This started 1 week ago.  Severity is moderate.  I, the attending physician,  performed the HPI with the patient and updated documentation appropriately.          Comments    Patient here for 1 retina follow up for laser Retinopexy OD. Patient states vision fine. No difference. No eye pain. Has had some headaches- allergy related.       Last edited by Rennis Chris, MD on 04/01/2019  1:44 PM. (History)    pt states    Referring physician: Linzie Collin, MD [address] [place],  [place] [address]  HISTORICAL INFORMATION:   Selected notes from the MEDICAL RECORD NUMBER Referred by Dr. Clydene Pugh for concern of retinal hole OD BCVA: 20/20 20/20 LEE: 03/13/2019   CURRENT MEDICATIONS: Current Outpatient Medications (Ophthalmic Drugs)  Medication Sig  . prednisoLONE acetate (PRED FORTE) 1 % ophthalmic suspension Place 1 drop into the right eye 4 (four) times daily for 7 days.  Marland Kitchen XIIDRA 5 % SOLN INSTILL 1 DROP OU BID   No current facility-administered medications for this visit. (Ophthalmic Drugs)   Current Outpatient Medications (Other)  Medication Sig  . Vitamin D, Ergocalciferol, (DRISDOL) 1.25 MG (50000 UT) CAPS capsule TAKE 1 CAPSULE BY MOUTH EVERY 7 DAYS (Patient not taking: Reported on 03/25/2019)   No current facility-administered medications for this visit. (Other)      REVIEW OF SYSTEMS: ROS    Positive for: Eyes   Negative for: Constitutional, Gastrointestinal, Neurological, Skin, Genitourinary, Musculoskeletal, HENT, Endocrine, Cardiovascular, Respiratory, Psychiatric, Allergic/Imm, Heme/Lymph   Last edited by Laddie Aquas, COA on  04/01/2019  1:37 PM. (History)       ALLERGIES Allergies  Allergen Reactions  . Fish Allergy Anaphylaxis    Freshwater fish cause asthma symptoms. No problem with saltwater fish  . Penicillins   . Sulfa Antibiotics     PAST MEDICAL HISTORY Past Medical History:  Diagnosis Date  . Childhood asthma   . Dry eyes   . GERD (gastroesophageal reflux disease)    Past Surgical History:  Procedure Laterality Date  . COLONOSCOPY WITH PROPOFOL N/A 11/25/2016   Procedure: COLONOSCOPY WITH PROPOFOL;  Surgeon: Midge Minium, MD;  Location: [facility];  Service: Endoscopy;  Laterality: N/A;  . SALPINGECTOMY  2010    FAMILY HISTORY Family History  Problem Relation Age of Onset  . Breast cancer Cousin        mat cousin  . Diabetes Father   . Diabetes Maternal Grandfather   . Hyperlipidemia Maternal Grandfather   . Diabetes Paternal Grandfather   . Hyperlipidemia Paternal Grandfather   . Glaucoma Mother   . Retinal detachment Paternal Aunt   . Ovarian cancer Neg Hx   . Colon cancer Neg Hx     SOCIAL HISTORY Social History   Tobacco Use  . Smoking status: Never Smoker  . Smokeless tobacco: Never Used  Substance Use Topics  . Alcohol use: No  . Drug use: No         OPHTHALMIC EXAM:  Base Eye Exam    Visual Acuity (Snellen - Linear)  Right Left   Dist cc 20/20 -2 20/25 -2   Dist ph cc  20/20 -1       Tonometry (Tonopen, 1:34 PM)      Right Left   Pressure 15 17       Pupils      Dark Light Shape React APD   Right 4 3 Round Brisk None   Left 4 3 Round Brisk None       Visual Fields (Counting fingers)      Left Right    Full Full       Extraocular Movement      Right Left    Full, Ortho Full, Ortho       Neuro/Psych    Oriented x3: Yes   Mood/Affect: Normal       Dilation    Both eyes: 1.0% Mydriacyl, 2.5% Phenylephrine @ 1:34 PM        Slit Lamp and Fundus Exam    Slit Lamp Exam      Right Left   Lids/Lashes Mild Meibomian gland  dysfunction Mild Meibomian gland dysfunction   Conjunctiva/Sclera White and quiet White and quiet   Cornea 3+ Punctate epithelial erosions 3+ Punctate epithelial erosions   Anterior Chamber Deep and quiet Deep and quiet   Iris Round and dilated Round and dilated   Lens 1-2+ Nuclear sclerosis, 1-2+ Cortical cataract 1-2+ Nuclear sclerosis, 1-2+ Cortical cataract   Vitreous Vitreous syneresis, no pigment, Posterior vitreous detachment, Weiss ring Vitreous syneresis       Fundus Exam      Right Left   Disc Pink and Sharp Pink and Sharp, +cupping   C/D Ratio 0.6 0.65   Macula Flat, Blunted foveal reflex, mild Retinal pigment epithelial mottling, No heme or edema Flat, good foveal reflex, mild Retinal pigment epithelial mottling, No heme or edema   Vessels Mild Vascular attenuation Mild Vascular attenuation   Periphery Attached, large patch of pigmented lattice with atrophic holes from 1000-1130, no SRF  Attached, focal patch of pigmented lattice at 0500        Refraction    Wearing Rx      Sphere Cylinder Axis   Right -5.25 Sphere    Left -6.00 +1.00 110          IMAGING AND PROCEDURES  Imaging and Procedures for @TODAY @  OCT, Retina - OU - Both Eyes       Right Eye Quality was good. Central Foveal Thickness: 259. Progression has been stable. Findings include no IRF, normal foveal contour, no SRF.   Left Eye Quality was good. Central Foveal Thickness: 261. Progression has been stable. Findings include normal foveal contour, no IRF, no SRF, vitreomacular adhesion .   Notes *Images captured and stored on drive  Diagnosis / Impression:  NFP, no IRF/SRF OU  Clinical management:  See below  Abbreviations: NFP - Normal foveal profile. CME - cystoid macular edema. PED - pigment epithelial detachment. IRF - intraretinal fluid. SRF - subretinal fluid. EZ - ellipsoid zone. ERM - epiretinal membrane. ORA - outer retinal atrophy. ORT - outer retinal tubulation. SRHM - subretinal  hyper-reflective material        Repair Retinal Breaks, Laser - OD - Right Eye       LASER PROCEDURE NOTE  Procedure:  Barrier laser retinopexy using slit lamp laser, RIGHT eye   Diagnosis:   Lattice degeneration w/ atrophic holes, RIGHT eye  Patches of lattice: 1000-1130   Surgeon: Rennis Chris, MD, PhD  Anesthesia: Topical  Informed consent obtained, operative eye marked, and time out performed prior to initiation of laser.   Laser settings:  Lumenis Smart532 laser, slit lamp Lens: [place]ster PRP 165 Power: 300 mW Spot size: 200 microns Duration: 30 msec  # spots: 527  Placement of laser: Using a [place]ster PRP 165 contact lens at the slit lamp, laser was placed in three confluent rows around patches of lattice w/ atrophic holes from 10-1128 oclock anterior to equator with additional rows anteriorly.  Complications: None.  Patient tolerated the procedure well and received written and verbal post-procedure care information/education.                 ASSESSMENT/PLAN:    ICD-10-CM   1. Bilateral retinal lattice degeneration  H35.413 Repair Retinal Breaks, Laser - OD - Right Eye  2. Retinal hole of right eye  H33.321 Repair Retinal Breaks, Laser - OD - Right Eye  3. Retinal edema  H35.81 OCT, Retina - OU - Both Eyes  4. Dry eyes  H04.123   5. Combined forms of age-related cataract of both eyes  H25.813     1,2. Peripheral lattice degeneration OU w/ atrophic holes OD  - OD: large patch of pigmented lattice with atrophic holes from 1000-1130, no SRF  - OS: focal patch of pigmented lattice at 0500  - discussed findings, prognosis, and treatment options including observation  - recommend laser retinopexy OD today, 03.22.21  - RBA of procedure discussed, questions answered  - informed consent obtained and signed  - see procedure note  - start PF qid OD x7 days  - f/u 2-3 weeks  3. No retinal edema on exam or OCT  4. Mixed form age related  cataracts OU  - The symptoms of cataract, surgical options, and treatments and risks were discussed with patient.  - discussed diagnosis and progression  - not yet visually significant  - monitor for now  5. Dry eyes OU  - managed by Dr. Clydene Pugh  - currently on Xiidra and lubricating gel/ointment at night    Ophthalmic Meds Ordered this visit:  Meds ordered this encounter  Medications  . prednisoLONE acetate (PRED FORTE) 1 % ophthalmic suspension    Sig: Place 1 drop into the right eye 4 (four) times daily for 7 days.    Dispense:  10 mL    Refill:  0       Return for f/u 2-3 weeks, s/p laser retinopexy OD, DFE, OCT.  There are no Patient Instructions on file for this visit.   Explained the diagnoses, plan, and follow up with the patient and they expressed understanding.  Patient expressed understanding of the importance of proper follow up care.   This document serves as a record of services personally performed by Karie Chimera, MD, PhD. It was created on their behalf by Herby Abraham, COA, a certified ophthalmic assistant. The creation of this record is the provider's dictation and/or activities during the visit.    Electronically signed by: Herby Abraham, COA @TODAY @ 2:36 PM   This document serves as a record of services personally performed by , MD, PhD. It was created on their behalf by Karie Chimera, OA, an ophthalmic assistant. The creation of this record is the provider's dictation and/or activities during the visit.    Electronically signed by: Laurian Brim, OA 03.22.2021 2:36 PM   03.24.2021, M.D., Ph.D. Diseases &  Surgery of the Retina and Vitreous [facility]  I have reviewed the above documentation for accuracy and completeness, and I agree with the above. Karie Chimera, M.D., Ph.D. 04/01/19 2:36 PM   Abbreviations: M myopia (nearsighted); A astigmatism; H hyperopia (farsighted); P presbyopia; Mrx spectacle  prescription;  CTL contact lenses; OD right eye; OS left eye; OU both eyes  XT exotropia; ET esotropia; PEK punctate epithelial keratitis; PEE punctate epithelial erosions; DES dry eye syndrome; MGD meibomian gland dysfunction; ATs artificial tears; PFAT's preservative free artificial tears; NSC nuclear sclerotic cataract; PSC posterior subcapsular cataract; ERM epi-retinal membrane; PVD posterior vitreous detachment; RD retinal detachment; DM diabetes mellitus; DR diabetic retinopathy; NPDR non-proliferative diabetic retinopathy; PDR proliferative diabetic retinopathy; CSME clinically significant macular edema; DME diabetic macular edema; dbh dot blot hemorrhages; CWS cotton wool spot; POAG primary open angle glaucoma; C/D cup-to-disc ratio; HVF humphrey visual field; GVF goldmann visual field; OCT optical coherence tomography; IOP intraocular pressure; BRVO Branch retinal vein occlusion; CRVO central retinal vein occlusion; CRAO central retinal artery occlusion; BRAO branch retinal artery occlusion; RT retinal tear; SB scleral buckle; PPV pars plana vitrectomy; VH Vitreous hemorrhage; PRP panretinal laser photocoagulation; IVK intravitreal kenalog; VMT vitreomacular traction; MH Macular hole;  NVD neovascularization of the disc; NVE neovascularization elsewhere; AREDS age related eye disease study; ARMD age related macular degeneration; POAG primary open angle glaucoma; EBMD epithelial/anterior basement membrane dystrophy; ACIOL anterior chamber intraocular lens; IOL intraocular lens; PCIOL posterior chamber intraocular lens; Phaco/IOL phacoemulsification with intraocular lens placement; PRK photorefractive keratectomy; LASIK laser assisted in situ keratomileusis; HTN hypertension; DM diabetes mellitus; COPD chronic obstructive pulmonary disease

## 2019-04-01 ENCOUNTER — Other Ambulatory Visit: Payer: Self-pay

## 2019-04-01 ENCOUNTER — Ambulatory Visit (INDEPENDENT_AMBULATORY_CARE_PROVIDER_SITE_OTHER): Payer: BLUE CROSS/BLUE SHIELD | Admitting: Ophthalmology

## 2019-04-01 ENCOUNTER — Encounter (INDEPENDENT_AMBULATORY_CARE_PROVIDER_SITE_OTHER): Payer: Self-pay | Admitting: Ophthalmology

## 2019-04-01 DIAGNOSIS — H25813 Combined forms of age-related cataract, bilateral: Secondary | ICD-10-CM

## 2019-04-01 DIAGNOSIS — H3581 Retinal edema: Secondary | ICD-10-CM

## 2019-04-01 DIAGNOSIS — H35413 Lattice degeneration of retina, bilateral: Secondary | ICD-10-CM

## 2019-04-01 DIAGNOSIS — H33321 Round hole, right eye: Secondary | ICD-10-CM | POA: Diagnosis not present

## 2019-04-01 DIAGNOSIS — H04123 Dry eye syndrome of bilateral lacrimal glands: Secondary | ICD-10-CM

## 2019-04-01 MED ORDER — PREDNISOLONE ACETATE 1 % OP SUSP: 1.0000 [drp] | Freq: Four times a day (QID) | OPHTHALMIC | 0 refills | Status: AC

## 2019-04-15 ENCOUNTER — Encounter (INDEPENDENT_AMBULATORY_CARE_PROVIDER_SITE_OTHER): Payer: BLUE CROSS/BLUE SHIELD | Admitting: Ophthalmology

## 2019-04-16 NOTE — Progress Notes (Signed)
[facility] - Clinic Note  04/19/2019     CHIEF COMPLAINT Patient presents for Retina Follow Up   HISTORY OF PRESENT ILLNESS: Kelly Fleming is a 54 y.o. female who presents to the clinic today for:   HPI    Retina Follow Up    Patient presents with  Retinal Break/Detachment.  In right eye.  Severity is moderate.  Duration of 2.5 weeks.  Since onset it is stable.  I, the attending physician,  performed the HPI with the patient and updated documentation appropriately.          Comments    Patient has seen an arc of light in vision in inferior vision OD. Has only seen twice since laser in OD. No increase in floaters/flashes. Vision the same OU.        Last edited by Rennis Chris, MD on 04/21/2019 12:34 AM. (History)    pt states twice over the past 2 days she has seen an arc of light inferiorly in her right eye, she states it only lasted a split second   Referring physician: Linzie Collin, MD [address] [place],  [place] [address]  HISTORICAL INFORMATION:   Selected notes from the MEDICAL RECORD NUMBER Referred by Dr. Clydene Pugh for concern of retinal hole OD BCVA: 20/20 20/20 LEE: 03/13/2019   CURRENT MEDICATIONS: Current Outpatient Medications (Ophthalmic Drugs)  Medication Sig  . XIIDRA 5 % SOLN INSTILL 1 DROP OU BID   No current facility-administered medications for this visit. (Ophthalmic Drugs)   Current Outpatient Medications (Other)  Medication Sig  . Vitamin D, Ergocalciferol, (DRISDOL) 1.25 MG (50000 UT) CAPS capsule TAKE 1 CAPSULE BY MOUTH EVERY 7 DAYS (Patient not taking: Reported on 04/19/2019)   No current facility-administered medications for this visit. (Other)      REVIEW OF SYSTEMS: ROS    Positive for: Eyes   Negative for: Constitutional, Gastrointestinal, Neurological, Skin, Genitourinary, Musculoskeletal, HENT, Endocrine, Cardiovascular, Respiratory, Psychiatric, Allergic/Imm, Heme/Lymph   Last  edited by Annalee Genta D, COT on 04/19/2019  1:23 PM. (History)       ALLERGIES Allergies  Allergen Reactions  . Fish Allergy Anaphylaxis    Freshwater fish cause asthma symptoms. No problem with saltwater fish  . Penicillins   . Sulfa Antibiotics     PAST MEDICAL HISTORY Past Medical History:  Diagnosis Date  . Childhood asthma   . Dry eyes   . GERD (gastroesophageal reflux disease)    Past Surgical History:  Procedure Laterality Date  . COLONOSCOPY WITH PROPOFOL N/A 11/25/2016   Procedure: COLONOSCOPY WITH PROPOFOL;  Surgeon: Midge Minium, MD;  Location: [facility];  Service: Endoscopy;  Laterality: N/A;  . SALPINGECTOMY  2010    FAMILY HISTORY Family History  Problem Relation Age of Onset  . Breast cancer Cousin        mat cousin  . Diabetes Father   . Diabetes Maternal Grandfather   . Hyperlipidemia Maternal Grandfather   . Diabetes Paternal Grandfather   . Hyperlipidemia Paternal Grandfather   . Glaucoma Mother   . Retinal detachment Paternal Aunt   . Ovarian cancer Neg Hx   . Colon cancer Neg Hx     SOCIAL HISTORY Social History   Tobacco Use  . Smoking status: Never Smoker  . Smokeless tobacco: Never Used  Substance Use Topics  . Alcohol use: No  . Drug use: No         OPHTHALMIC EXAM:  Base  Eye Exam    Visual Acuity (Snellen - Linear)      Right Left   Dist cc 20/40 20/20   Dist ph cc NI        Tonometry (Tonopen, 1:28 PM)      Right Left   Pressure 19 20       Pupils      Dark Light Shape React APD   Right 4 3 Round Brisk None   Left 4 3 Round Brisk None       Visual Fields (Counting fingers)      Left Right    Full Full       Extraocular Movement      Right Left    Full, Ortho Full, Ortho       Neuro/Psych    Oriented x3: Yes   Mood/Affect: Normal       Dilation    Both eyes: 1.0% Mydriacyl, 2.5% Phenylephrine @ 1:28 PM        Slit Lamp and Fundus Exam    Slit Lamp Exam      Right Left   Lids/Lashes  Mild Meibomian gland dysfunction Mild Meibomian gland dysfunction   Conjunctiva/Sclera White and quiet White and quiet   Cornea 1+ inferior Punctate epithelial erosions 1+ inferior Punctate epithelial erosions   Anterior Chamber Deep and quiet Deep and quiet   Iris Round and dilated Round and dilated   Lens 1-2+ Nuclear sclerosis, 1-2+ Cortical cataract 1-2+ Nuclear sclerosis, 1-2+ Cortical cataract   Vitreous Vitreous syneresis, no pigment, Posterior vitreous detachment, Weiss ring Vitreous syneresis       Fundus Exam      Right Left   Disc Pink and Sharp Pink and Sharp, +cupping   C/D Ratio 0.6 0.65   Macula Flat, Blunted foveal reflex, mild Retinal pigment epithelial mottling, No heme or edema Flat, good foveal reflex, mild Retinal pigment epithelial mottling, No heme or edema   Vessels Mild Vascular attenuation Mild Vascular attenuation   Periphery Attached, large patch of pigmented lattice with atrophic holes from 1000-1130, no SRF, pigmented cystoid degeneration inferiorly -- good laser surrounding Attached, focal patch of pigmented lattice at 0500, focal, perivascular pigmented changes at 0700        Refraction    Wearing Rx      Sphere Cylinder Axis   Right -5.25 Sphere    Left -6.00 +1.00 110       Manifest Refraction      Sphere Cylinder Axis Dist VA   Right -4.25 +0.50 152 20/20   Left              IMAGING AND PROCEDURES  Imaging and Procedures for @TODAY @  OCT, Retina - OU - Both Eyes       Right Eye Quality was good. Central Foveal Thickness: 260. Progression has been stable. Findings include no IRF, normal foveal contour, no SRF.   Left Eye Quality was good. Central Foveal Thickness: 262. Progression has been stable. Findings include normal foveal contour, no IRF, no SRF, vitreomacular adhesion .   Notes *Images captured and stored on drive  Diagnosis / Impression:  NFP, no IRF/SRF OU  Clinical management:  See below  Abbreviations: NFP - Normal  foveal profile. CME - cystoid macular edema. PED - pigment epithelial detachment. IRF - intraretinal fluid. SRF - subretinal fluid. EZ - ellipsoid zone. ERM - epiretinal membrane. ORA - outer retinal atrophy. ORT - outer retinal tubulation. SRHM - subretinal hyper-reflective material  ASSESSMENT/PLAN:    ICD-10-CM   1. Bilateral retinal lattice degeneration  H35.413   2. 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 Retinal edema  H35.81 OCT, Retina - OU - Both Eyes  4. Combined forms of age-related cataract of both eyes  H25.813   5. Dry eyes  H04.123     1,2. Peripheral lattice degeneration OU w/ atrophic holes OD  - OD: large patch of pigmented lattice with atrophic holes from 1000-1130, no SRF  - OS: focal patch of pigmented lattice at 0500  - s/p laser retinopexy OD (03.22.21) -- good laser surrounding  - completed PF qid OD x7 days  - f/u 3 months  3. No retinal edema on exam or OCT  4. Mixed form age related cataracts OU  - The symptoms of cataract, surgical options, and treatments and risks were discussed with patient.  - discussed diagnosis and progression  - not yet visually significant  - monitor for now  5. Dry eyes OU  - managed by Dr. Ellin Mayhew  - currently on Xiidra and lubricating gel/ointment at night    Ophthalmic Meds Ordered this visit:  No orders of the defined types were placed in this encounter.      Return in about 3 months (around 07/19/2019) for f/u lattice degeneration OU, DFE, OCT.  There are no Patient Instructions on file for this visit.   Explained the diagnoses, plan, and follow up with the patient and they expressed understanding.  Patient expressed understanding of the importance of proper follow up care.   This document serves as a record of services personally performed by Gardiner Sleeper, MD, PhD. It was created on their behalf by Ernest Mallick, OA, an ophthalmic assistant. The creation of this record is the provider's  dictation and/or activities during the visit.    Electronically signed by: Ernest Mallick, OA 04.06.2021 12:36 AM   Gardiner Sleeper, M.D., Ph.D. Diseases & Surgery of the Retina and Vitreous [facility]  I have reviewed the above documentation for accuracy and completeness, and I agree with the above. Gardiner Sleeper, M.D., Ph.D. 04/21/19 12:36 AM    Abbreviations: M myopia (nearsighted); A astigmatism; H hyperopia (farsighted); P presbyopia; Mrx spectacle prescription;  CTL contact lenses; OD right eye; OS left eye; OU both eyes  XT exotropia; ET esotropia; PEK punctate epithelial keratitis; PEE punctate epithelial erosions; DES dry eye syndrome; MGD meibomian gland dysfunction; ATs artificial tears; PFAT's preservative free artificial tears; [place] nuclear sclerotic cataract; PSC posterior subcapsular cataract; ERM epi-retinal membrane; PVD posterior vitreous detachment; RD retinal detachment; DM diabetes mellitus; DR diabetic retinopathy; NPDR non-proliferative diabetic retinopathy; PDR proliferative diabetic retinopathy; CSME clinically significant macular edema; DME diabetic macular edema; dbh dot blot hemorrhages; CWS cotton wool spot; POAG primary open angle glaucoma; C/D cup-to-disc ratio; HVF humphrey visual field; GVF goldmann visual field; OCT optical coherence tomography; IOP intraocular pressure; BRVO Branch retinal vein occlusion; CRVO central retinal vein occlusion; CRAO central retinal artery occlusion; BRAO branch retinal artery occlusion; RT retinal tear; SB scleral buckle; PPV pars plana vitrectomy; VH Vitreous hemorrhage; PRP panretinal laser photocoagulation; IVK intravitreal kenalog; VMT vitreomacular traction; MH Macular hole;  NVD neovascularization of the disc; NVE neovascularization elsewhere; AREDS age related eye disease study; ARMD age related macular degeneration; POAG primary open angle glaucoma; EBMD epithelial/anterior basement membrane dystrophy;  ACIOL anterior chamber intraocular lens; IOL intraocular lens; PCIOL posterior chamber intraocular lens; Phaco/IOL phacoemulsification with intraocular lens placement; [place]  photorefractive keratectomy; LASIK laser assisted in situ keratomileusis; HTN hypertension; DM diabetes mellitus; COPD chronic obstructive pulmonary disease

## 2019-04-19 ENCOUNTER — Encounter (INDEPENDENT_AMBULATORY_CARE_PROVIDER_SITE_OTHER): Payer: Self-pay | Admitting: Ophthalmology

## 2019-04-19 ENCOUNTER — Other Ambulatory Visit: Payer: Self-pay

## 2019-04-19 ENCOUNTER — Ambulatory Visit (INDEPENDENT_AMBULATORY_CARE_PROVIDER_SITE_OTHER): Payer: BLUE CROSS/BLUE SHIELD | Admitting: Ophthalmology

## 2019-04-19 DIAGNOSIS — H04123 Dry eye syndrome of bilateral lacrimal glands: Secondary | ICD-10-CM

## 2019-04-19 DIAGNOSIS — H33321 Round hole, right eye: Secondary | ICD-10-CM

## 2019-04-19 DIAGNOSIS — H3581 Retinal edema: Secondary | ICD-10-CM | POA: Diagnosis not present

## 2019-04-19 DIAGNOSIS — H35413 Lattice degeneration of retina, bilateral: Secondary | ICD-10-CM

## 2019-04-19 DIAGNOSIS — H25813 Combined forms of age-related cataract, bilateral: Secondary | ICD-10-CM

## 2019-04-21 ENCOUNTER — Encounter (INDEPENDENT_AMBULATORY_CARE_PROVIDER_SITE_OTHER): Payer: Self-pay | Admitting: Ophthalmology

## 2019-04-22 ENCOUNTER — Encounter (INDEPENDENT_AMBULATORY_CARE_PROVIDER_SITE_OTHER): Payer: BLUE CROSS/BLUE SHIELD | Admitting: Ophthalmology

## 2019-07-19 ENCOUNTER — Other Ambulatory Visit: Payer: Self-pay

## 2019-07-19 ENCOUNTER — Encounter (INDEPENDENT_AMBULATORY_CARE_PROVIDER_SITE_OTHER): Payer: Self-pay

## 2019-07-19 ENCOUNTER — Encounter (INDEPENDENT_AMBULATORY_CARE_PROVIDER_SITE_OTHER): Payer: Self-pay | Admitting: Ophthalmology

## 2019-09-18 ENCOUNTER — Other Ambulatory Visit: Payer: Self-pay | Admitting: Obstetrics and Gynecology

## 2019-09-18 DIAGNOSIS — Z1231 Encounter for screening mammogram for malignant neoplasm of breast: Secondary | ICD-10-CM

## 2019-12-04 ENCOUNTER — Encounter: Payer: BLUE CROSS/BLUE SHIELD | Admitting: Obstetrics and Gynecology

## 2019-12-13 ENCOUNTER — Encounter: Payer: BLUE CROSS/BLUE SHIELD | Admitting: Obstetrics and Gynecology

## 2019-12-20 ENCOUNTER — Encounter: Payer: BLUE CROSS/BLUE SHIELD | Admitting: Obstetrics and Gynecology

## 2020-01-07 ENCOUNTER — Encounter: Payer: BLUE CROSS/BLUE SHIELD | Admitting: Obstetrics and Gynecology

## 2020-01-10 ENCOUNTER — Ambulatory Visit: Admit: 2020-01-10 | Discharge status: Absent without leave | Payer: BLUE CROSS/BLUE SHIELD

## 2020-01-21 ENCOUNTER — Ambulatory Visit
Admission: RE | Admit: 2020-01-21 | Discharge: 2020-01-21 | Disposition: A | Discharge status: Home / Self Care | Payer: BC Managed Care – PPO | Source: Ambulatory Visit | Attending: Obstetrics and Gynecology | Admitting: Obstetrics and Gynecology

## 2020-01-21 ENCOUNTER — Encounter: Payer: Self-pay | Admitting: Obstetrics and Gynecology

## 2020-01-21 ENCOUNTER — Other Ambulatory Visit (HOSPITAL_COMMUNITY)
Admission: RE | Admit: 2020-01-21 | Discharge: 2020-01-21 | Disposition: A | Discharge status: Home / Self Care | Payer: BC Managed Care – PPO | Source: Ambulatory Visit | Attending: Obstetrics and Gynecology | Admitting: Obstetrics and Gynecology

## 2020-01-21 ENCOUNTER — Other Ambulatory Visit: Payer: Self-pay

## 2020-01-21 ENCOUNTER — Ambulatory Visit (INDEPENDENT_AMBULATORY_CARE_PROVIDER_SITE_OTHER): Payer: BC Managed Care – PPO | Admitting: Obstetrics and Gynecology

## 2020-01-21 VITALS — BP 114/63 | HR 75 | Ht 64.0 in | Wt 146.1 lb

## 2020-01-21 DIAGNOSIS — Z1231 Encounter for screening mammogram for malignant neoplasm of breast: Secondary | ICD-10-CM | POA: Insufficient documentation

## 2020-01-21 DIAGNOSIS — Z124 Encounter for screening for malignant neoplasm of cervix: Secondary | ICD-10-CM | POA: Diagnosis not present

## 2020-01-21 DIAGNOSIS — Z01419 Encounter for gynecological examination (general) (routine) without abnormal findings: Secondary | ICD-10-CM | POA: Diagnosis not present

## 2020-01-21 NOTE — Addendum Note (Signed)
Addended by: Dorian Pod on: 01/21/2020 11:15 AM   Modules accepted: Orders

## 2020-01-21 NOTE — Progress Notes (Signed)
HPI:      Kelly Fleming is a 55 y.o. [phone] who LMP was Patient's last menstrual period was 01/16/2020.  Subjective:   She presents today for her annual examination.  She states that her periods have become somewhat irregular and has occasional hot flashes.  She is otherwise well.  She plans to return for her blood work when she is fasting. Patient remains unvaccinated and is convinced that the risks of the vaccine outweigh the benefits. Patient also complains of occasional vaginal dryness with intercourse that she uses lubricants.     Hx: The following portions of the patient's history were reviewed and updated as appropriate:             She  has a past medical history of Childhood asthma, Dry eyes, and GERD (gastroesophageal reflux disease). She does not have any pertinent problems on file. She  has a past surgical history that includes Salpingectomy (2010) and Colonoscopy with propofol (N/A, 11/25/2016). Her family history includes Breast cancer in her cousin; Diabetes in her father, maternal grandfather, and paternal grandfather; Glaucoma in her mother; Hyperlipidemia in her maternal grandfather and paternal grandfather; Retinal detachment in her paternal aunt. She  reports that she has never smoked. She has never used smokeless tobacco. She reports that she does not drink alcohol and does not use drugs. She has a current medication list which includes the following prescription(s): xiidra and vitamin d (ergocalciferol). She is allergic to fish allergy, penicillins, and sulfa antibiotics.       Review of Systems:  Review of Systems  Constitutional: Denied constitutional symptoms, night sweats, recent illness, fatigue, fever, insomnia and weight loss.  Eyes: Denied eye symptoms, eye pain, photophobia, vision change and visual disturbance.  Ears/Nose/Throat/Neck: Denied ear, nose, throat or neck symptoms, hearing loss, nasal discharge, sinus congestion and sore throat.   Cardiovascular: Denied cardiovascular symptoms, arrhythmia, chest pain/pressure, edema, exercise intolerance, orthopnea and palpitations.  Respiratory: Denied pulmonary symptoms, asthma, pleuritic pain, productive sputum, cough, dyspnea and wheezing.  Gastrointestinal: Denied, gastro-esophageal reflux, melena, nausea and vomiting.  Genitourinary: See HPI for additional information.   Musculoskeletal: Denied musculoskeletal symptoms, stiffness, swelling, muscle weakness and myalgia.  Dermatologic: Denied dermatology symptoms, rash and scar.  Neurologic: Denied neurology symptoms, dizziness, headache, neck pain and syncope.  Psychiatric: Denied psychiatric symptoms, anxiety and depression.  Endocrine: Denied endocrine symptoms including hot flashes and night sweats.   Meds:   Current Outpatient Medications on File Prior to Visit  Medication Sig Dispense Refill  . XIIDRA 5 % SOLN INSTILL 1 DROP OU BID  6  . Vitamin D, Ergocalciferol, (DRISDOL) 1.25 MG (50000 UT) CAPS capsule TAKE 1 CAPSULE BY MOUTH EVERY 7 DAYS (Patient not taking: No sig reported) 12 capsule 6   No current facility-administered medications on file prior to visit.           Objective:     Vitals:   01/21/20 0915  BP: 114/63  Pulse: 75    Filed Weights   01/21/20 0915  Weight: 146 lb 1.6 oz (66.3 kg)              Physical examination General NAD, Conversant  HEENT Atraumatic; Op clear with mmm.  Normo-cephalic. Pupils reactive. Anicteric sclerae  Thyroid/Neck Smooth without nodularity or enlargement. Normal ROM.  Neck Supple.  Skin No rashes, lesions or ulceration. Normal palpated skin turgor. No nodularity.  Breasts: No masses or discharge.  Symmetric.  No axillary adenopathy.  Lungs: Clear to auscultation.No rales or  wheezes. Normal Respiratory effort, no retractions.  Heart: NSR.  No murmurs or rubs appreciated. No periferal edema  Abdomen: Soft.  Non-tender.  No masses.  No HSM. No hernia   Extremities: Moves all appropriately.  Normal ROM for age. No lymphadenopathy.  Neuro: Oriented to PPT.  Normal mood. Normal affect.     Pelvic:   Vulva: Normal appearance.  No lesions.  Vagina: No lesions or abnormalities noted.  Support:  Second-degree uterine descensus.  Urethra No masses tenderness or scarring.  Meatus Normal size without lesions or prolapse.  Cervix: Normal appearance.  No lesions.  Anus: Normal exam.  No lesions.  Perineum: Normal exam.  No lesions.        Bimanual   Uterus:  Top normal size.  Non-tender.  Mobile.  AV.  Adnexae: No masses.  Non-tender to palpation.  Cul-de-sac: Negative for abnormality.      Assessment:    [id number] Patient Active Problem List   Diagnosis Date Noted  . Special screening for malignant neoplasms, colon   . 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 Dry eye syndrome 07/08/2011  . Myopia of both eyes 07/08/2011  . Presbyopia 07/08/2011     1. Encounter for gynecological examination without abnormal finding        Plan:            1.  Basic Screening Recommendations The basic screening recommendations for asymptomatic women were discussed with the patient during her visit.  The age-appropriate recommendations were discussed with her and the rational for the tests reviewed.  When I am informed by the patient that another primary care physician has previously obtained the age-appropriate tests and they are up-to-date, only outstanding tests are ordered and referrals given as necessary.  Abnormal results of tests will be discussed with her when all of her results are completed.  Routine preventative health maintenance measures emphasized: Exercise/Diet/Weight control, Tobacco Warnings, Alcohol/Substance use risks and Stress Management Patient will return for blood work when fasting -Pap smear performed today -mammogram ordered 2. discussed necessity of COVID vaccination- patient not convinced. 3. begin discussion regarding  hormone replacement therapy with irregular cycles also included discussion regarding vaginal dryness and HRT. Orders Orders Placed This Encounter  Procedures  . Hemoglobin A1c  . Lipid panel  . TSH    No orders of the defined types were placed in this encounter.         F/U  Return in about 1 year (around 01/20/2021) for Annual Physical.  Elonda Husky, M.D. 01/21/2020 10:07 AM

## 2020-01-24 LAB — CYTOLOGY - PAP
Comment: NEGATIVE
Diagnosis: NEGATIVE
High risk HPV: NEGATIVE

## 2020-02-04 ENCOUNTER — Other Ambulatory Visit: Payer: Self-pay

## 2020-02-04 ENCOUNTER — Other Ambulatory Visit: Payer: BC Managed Care – PPO

## 2020-02-05 LAB — TSH: TSH: 3.3 u[IU]/mL (ref 0.450–4.500)

## 2020-02-05 LAB — LIPID PANEL
Chol/HDL Ratio: 4.5 ratio — ABNORMAL HIGH (ref 0.0–4.4)
Cholesterol, Total: 196 mg/dL (ref 100–199)
HDL: 44 mg/dL (ref >39)
LDL Chol Calc (NIH): 137 mg/dL — ABNORMAL HIGH (ref 0–99)
Triglycerides: 84 mg/dL (ref 0–149)
VLDL Cholesterol Cal: 15 mg/dL (ref 5–40)

## 2020-02-05 LAB — HEMOGLOBIN A1C
Est. average glucose Bld gHb Est-mCnc: 108 mg/dL
Hgb A1c MFr Bld: 5.4 % (ref 4.8–5.6)

## 2020-02-24 ENCOUNTER — Other Ambulatory Visit: Payer: BC Managed Care – PPO

## 2020-09-15 ENCOUNTER — Other Ambulatory Visit: Payer: Self-pay | Admitting: Obstetrics and Gynecology

## 2020-09-15 DIAGNOSIS — Z1231 Encounter for screening mammogram for malignant neoplasm of breast: Secondary | ICD-10-CM

## 2020-09-22 ENCOUNTER — Ambulatory Visit: Payer: BC Managed Care – PPO | Admitting: Obstetrics and Gynecology

## 2020-09-22 ENCOUNTER — Other Ambulatory Visit: Payer: Self-pay

## 2020-09-22 ENCOUNTER — Encounter: Payer: Self-pay | Admitting: Obstetrics and Gynecology

## 2020-09-22 VITALS — BP 118/64 | HR 64 | Ht 64.0 in | Wt 146.0 lb

## 2020-09-22 DIAGNOSIS — R300 Dysuria: Secondary | ICD-10-CM | POA: Diagnosis not present

## 2020-09-22 DIAGNOSIS — R399 Unspecified symptoms and signs involving the genitourinary system: Secondary | ICD-10-CM

## 2020-09-22 DIAGNOSIS — N3001 Acute cystitis with hematuria: Secondary | ICD-10-CM | POA: Diagnosis not present

## 2020-09-22 LAB — POCT URINALYSIS DIPSTICK
Bilirubin, UA: NEGATIVE
Glucose, UA: NEGATIVE
Ketones, UA: NEGATIVE
Nitrite, UA: NEGATIVE
Protein, UA: NEGATIVE
Spec Grav, UA: <=1.005 — AB (ref 1.010–1.025)
Urobilinogen, UA: 0.2 E.U./dL
pH, UA: 6.5 (ref 5.0–8.0)

## 2020-09-22 MED ORDER — NITROFURANTOIN MONOHYD MACRO 100 MG PO CAPS: 100.0000 mg | ORAL_CAPSULE | Freq: Two times a day (BID) | ORAL | 1 refills | Status: DC

## 2020-09-22 NOTE — Progress Notes (Signed)
Pt present due to bladder issues. Pt c/o pain with urination, lower abd pain, cloudy urine with odor all started on Sunday, Sept 11th.

## 2020-09-22 NOTE — Progress Notes (Signed)
HPI:      [name] is a 55 y.o. [phone] who LMP was Patient's last menstrual period was 08/12/2020.  Subjective:   She presents today complaining of a several day history of pain with urination.  She has had a bladder infection before and believes that that is what is going on now.  She also has a mild headache which she thinks may be due to "allergies".    Hx: The following portions of the patient's history were reviewed and updated as appropriate:             She  has a past medical history of Childhood asthma, Dry eyes, and GERD (gastroesophageal reflux disease). She does not have any pertinent problems on file. She  has a past surgical history that includes Salpingectomy (2010) and Colonoscopy with propofol (N/A, 11/25/2016). Her family history includes Breast cancer in her cousin; Diabetes in her father, maternal grandfather, and paternal grandfather; Glaucoma in her mother; Hyperlipidemia in her maternal grandfather and paternal grandfather; Retinal detachment in her paternal aunt. She  reports that she has never smoked. She has never used smokeless tobacco. She reports that she does not drink alcohol and does not use drugs. She has a current medication list which includes the following prescription(s): nitrofurantoin (macrocrystal-monohydrate), OVER THE COUNTER MEDICATION, and xiidra. She is allergic to fish allergy, penicillins, and sulfa antibiotics.       Review of Systems:  Review of Systems  Constitutional: Denied constitutional symptoms, night sweats, recent illness, fatigue, fever, insomnia and weight loss.  Eyes: Denied eye symptoms, eye pain, photophobia, vision change and visual disturbance.  Ears/Nose/Throat/Neck: Denied ear, nose, throat or neck symptoms, hearing loss, nasal discharge, sinus congestion and sore throat.  Cardiovascular: Denied cardiovascular symptoms, arrhythmia, chest pain/pressure, edema, exercise intolerance, orthopnea and palpitations.   Respiratory: Denied pulmonary symptoms, asthma, pleuritic pain, productive sputum, cough, dyspnea and wheezing.  Gastrointestinal: Denied, gastro-esophageal reflux, melena, nausea and vomiting.  Genitourinary: See HPI for additional information.  Musculoskeletal: Denied musculoskeletal symptoms, stiffness, swelling, muscle weakness and myalgia.  Dermatologic: Denied dermatology symptoms, rash and scar.  Neurologic: Denied neurology symptoms, dizziness, headache, neck pain and syncope.  Psychiatric: Denied psychiatric symptoms, anxiety and depression.  Endocrine: Denied endocrine symptoms including hot flashes and night sweats.   Meds:   Current Outpatient Medications on File Prior to Visit  Medication Sig Dispense Refill   OVER THE COUNTER MEDICATION Eye ointment; once daily at bedtime     XIIDRA 5 % SOLN INSTILL 1 DROP OU BID  6   No current facility-administered medications on file prior to visit.      Objective:     Vitals:   09/22/20 1114  BP: 118/64  Pulse: 64   Filed Weights   09/22/20 1114  Weight: 146 lb (66.2 kg)              Urinalysis shows hematuria and elevated WBCs.          Assessment:    [id number] Patient Active Problem List   Diagnosis Date Noted   Special screening for malignant neoplasms, colon    Blepharitis of both eyes 07/08/2011   Dry eye syndrome 07/08/2011   Myopia of both eyes 07/08/2011   Presbyopia 07/08/2011     1. Acute cystitis with hematuria   2. UTI symptoms   3. Dysuria        Plan:            1.  We will treat with Macrobid  for acute cystitis with hematuria.  2.  Encouraged increased p.o. fluids  3.  Use of AZO for dysuria discussed.  4.  Urine for C&S. Orders Orders Placed This Encounter  Procedures   Urine Culture   POCT urinalysis dipstick     Meds ordered this encounter  Medications   nitrofurantoin, macrocrystal-monohydrate, (MACROBID) 100 MG capsule    Sig: Take 1 capsule (100 mg total) by mouth 2 (two)  times daily.    Dispense:  14 capsule    Refill:  1      F/U  Return for Annual Physical. I spent 13 minutes involved in the care of this patient preparing to see the patient by obtaining and reviewing her medical history (including labs, imaging tests and prior procedures), documenting clinical information in the electronic health record (EHR), counseling and coordinating care plans, writing and sending prescriptions, ordering tests or procedures and in direct communicating with the patient and medical staff discussing pertinent items from her history and physical exam.  Elonda Husky, M.D. 09/22/2020 11:41 AM

## 2020-09-25 ENCOUNTER — Telehealth: Payer: Self-pay | Admitting: Obstetrics and Gynecology

## 2020-09-25 NOTE — Telephone Encounter (Signed)
Tried to return patients call no answer. LVM for her to rtc on Monday.

## 2020-09-25 NOTE — Telephone Encounter (Signed)
Kelly Fleming called in and states the medication that was prescribed for her infection (MACROBID) she is allergic to it and needs something else called in.  Patient uses [facility] in [place].

## 2020-09-28 ENCOUNTER — Other Ambulatory Visit: Payer: Self-pay

## 2020-09-28 MED ORDER — CIPROFLOXACIN HCL 500 MG PO TABS: 500.0000 mg | ORAL_TABLET | Freq: Two times a day (BID) | ORAL | 0 refills | Status: DC

## 2020-09-29 LAB — URINE CULTURE

## 2020-10-02 ENCOUNTER — Telehealth: Payer: Self-pay

## 2020-10-02 NOTE — Telephone Encounter (Signed)
Call transferred from front desk- 9/19 935am  Pt states she called last week on Monday for a possible uti. She was made an appt for Tuesday at 1045. She was running late and got here at 1050. Front desk informed her of the 5 minute late policy. Let pt know that she would have to ask the provider if she could be seen. Pt aware 5 minutes later that she could check her in and the provider would see her.   She gave her sample and nurse took her vitals. She waited for DJE for sometime in the exam room. He called her in a ATB- macrobid.  Pt called Friday morning at 855am and stated she was having an allergic reaction to the ATB. She needed something else called in.   At 445 pt had not heard anything so she contacted the office and front desk was unable to get the nurse as she was in a room with a pt. She informed the pt  that she would let then nurse know that she called again.   515 pt receives vm stating please contact office on Monday. Pt was upset as she feels we could have easily let her know a new med was prescribed. She was frustrated as she had to wait all weekend.   I apologized to the pt for not meeting her expectation. Documented her allergy. Per DJE Erxed Cipro. Urine culture +. Cipro will treat.     5pm (9/16)- nurse ask provider how she needs to handle this and what med can she give pt. Provider ask nurse to call pt and inquiry about her allergies and what kind of reaction she had in the past. Pt did not answer. No vm left at that time. Per Provider request nurse called a 2nd time and no answer,  left VM.

## 2021-01-21 ENCOUNTER — Encounter: Payer: BC Managed Care – PPO | Admitting: Obstetrics and Gynecology

## 2021-01-22 ENCOUNTER — Encounter: Payer: BC Managed Care – PPO | Admitting: Obstetrics and Gynecology

## 2021-02-16 ENCOUNTER — Telehealth: Payer: Self-pay | Admitting: Obstetrics and Gynecology

## 2021-02-16 NOTE — Telephone Encounter (Signed)
Pt called stating that she believes that she has a possible bladder infection- pt was last seen 9/13 for same issue.  Pt states that medication prescribed did help, states symptoms started last night including, cloudy urine, odor and pain. Confirmed pharmacy as [facility]- [place]. Please Advise.

## 2021-02-17 ENCOUNTER — Other Ambulatory Visit: Payer: Self-pay

## 2021-02-17 DIAGNOSIS — N3001 Acute cystitis with hematuria: Secondary | ICD-10-CM

## 2021-02-17 MED ORDER — NITROFURANTOIN MONOHYD MACRO 100 MG PO CAPS: 100.0000 mg | ORAL_CAPSULE | Freq: Two times a day (BID) | ORAL | 1 refills | Status: DC

## 2021-02-17 NOTE — Telephone Encounter (Signed)
Per provider Macrobid has been sent in to patients pharmacy. Patient made aware and voiced understanding.

## 2021-02-17 NOTE — Telephone Encounter (Signed)
Pt called this morning following up on status of call- asking if she need to come in or not? Made pt aware call was directed to provider just waiting response.

## 2021-02-22 ENCOUNTER — Other Ambulatory Visit: Payer: Self-pay

## 2021-02-22 ENCOUNTER — Telehealth: Payer: Self-pay | Admitting: Obstetrics and Gynecology

## 2021-02-22 DIAGNOSIS — R399 Unspecified symptoms and signs involving the genitourinary system: Secondary | ICD-10-CM

## 2021-02-22 MED ORDER — CIPROFLOXACIN HCL 500 MG PO TABS: 500.0000 mg | ORAL_TABLET | Freq: Two times a day (BID) | ORAL | 0 refills | Status: DC

## 2021-02-22 NOTE — Telephone Encounter (Signed)
Pt called stating that she had a RX sent to pharmacy for her last week- and it is a medication hat she is allergic too.

## 2021-02-22 NOTE — Progress Notes (Signed)
Spoke with patient. Patient stated taking the macrobid for 3 doses and had a rash, states no longer taking medication and doing well. Patient was informed a new prescription was being sent in for Cipro to her pharmacy. Patient voiced understanding.

## 2021-03-05 ENCOUNTER — Other Ambulatory Visit: Payer: Self-pay

## 2021-03-05 ENCOUNTER — Ambulatory Visit
Admission: RE | Admit: 2021-03-05 | Discharge: 2021-03-05 | Disposition: A | Discharge status: Home / Self Care | Payer: BC Managed Care – PPO | Source: Ambulatory Visit | Attending: Obstetrics and Gynecology | Admitting: Obstetrics and Gynecology

## 2021-03-05 ENCOUNTER — Encounter: Payer: BC Managed Care – PPO | Admitting: Obstetrics and Gynecology

## 2021-03-05 DIAGNOSIS — Z1231 Encounter for screening mammogram for malignant neoplasm of breast: Secondary | ICD-10-CM | POA: Insufficient documentation

## 2021-04-06 ENCOUNTER — Encounter: Payer: BC Managed Care – PPO | Admitting: Obstetrics and Gynecology

## 2021-06-15 IMAGING — MG DIGITAL SCREENING BILAT W/ TOMO W/ CAD
8 series · 9 of 24 positions shown · non-contrast
Comparison: Previous exam(s).

CLINICAL DATA: Screening.

EXAM:
DIGITAL SCREENING BILATERAL MAMMOGRAM WITH TOMO AND CAD

[R CC synth-2D]
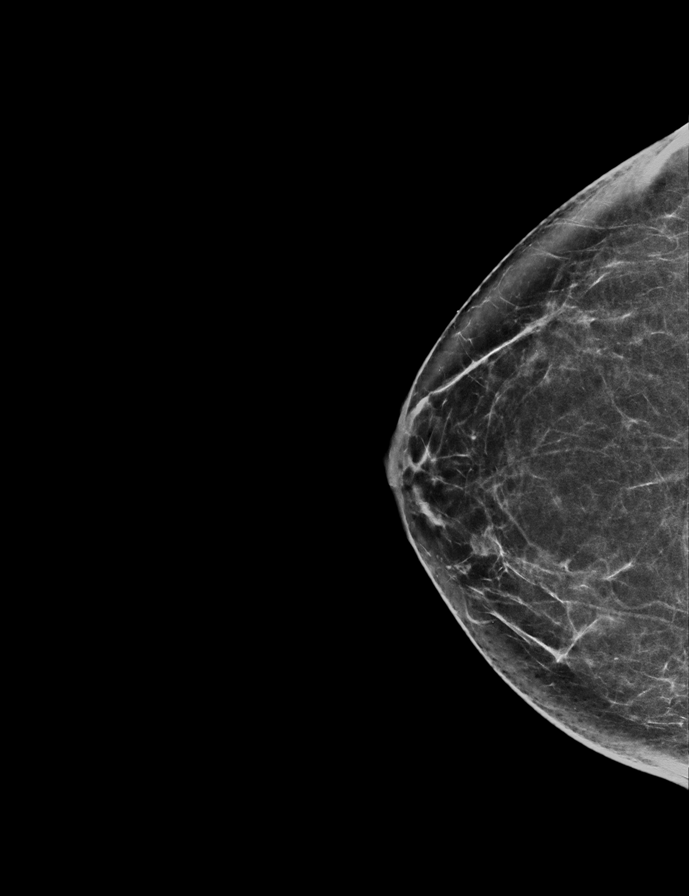

[L MLO synth-2D]
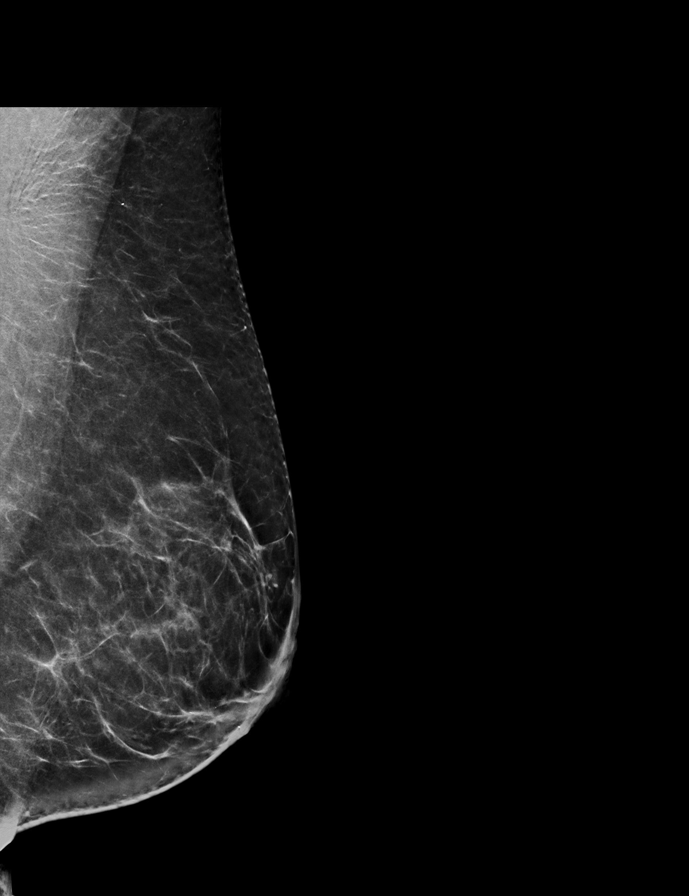

[L CC synth-2D]
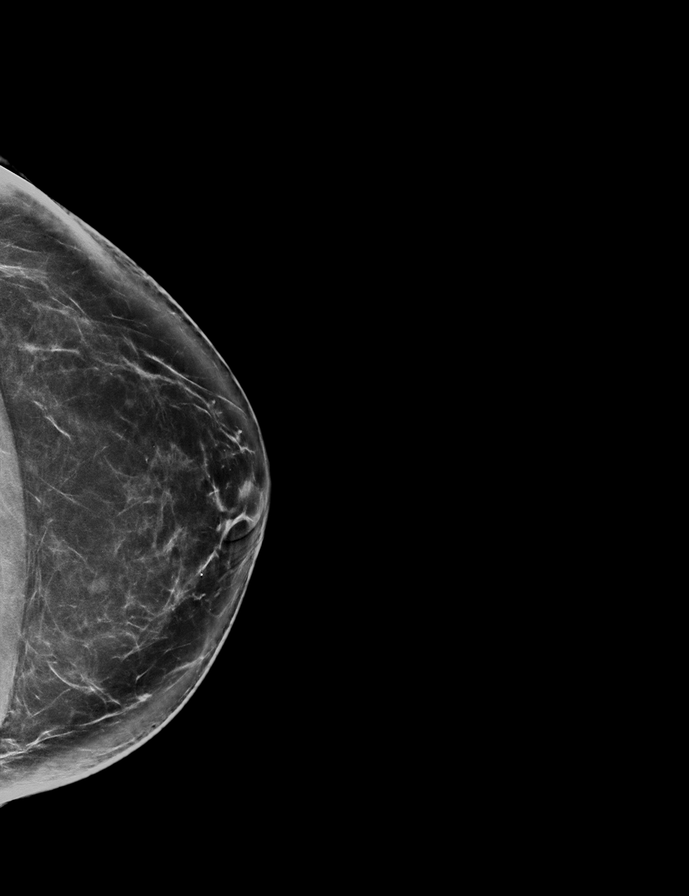

[R MLO synth-2D]
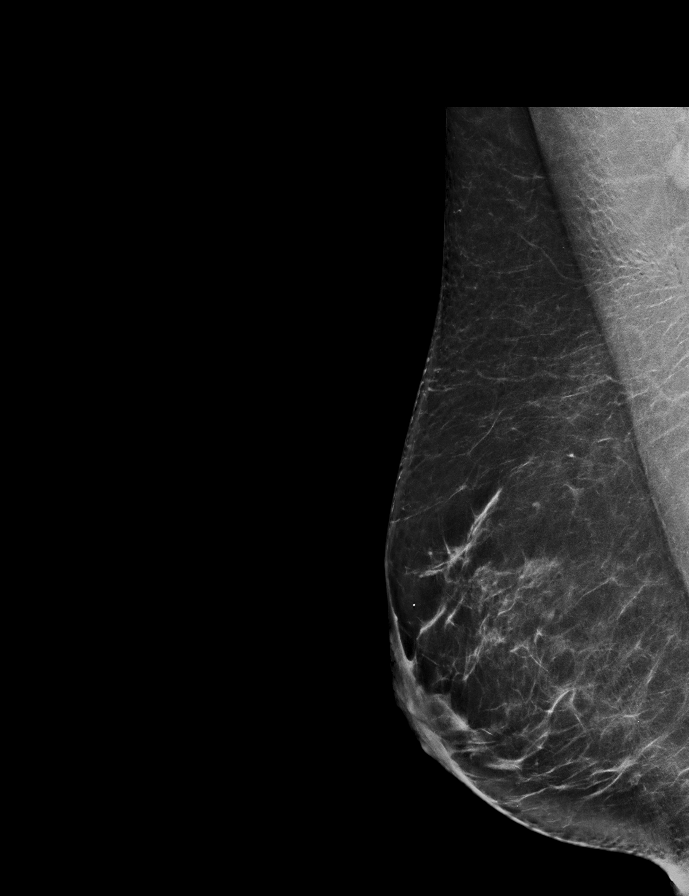

[L CC tomo · 2 of 78 frames shown]
[frame 26/78]
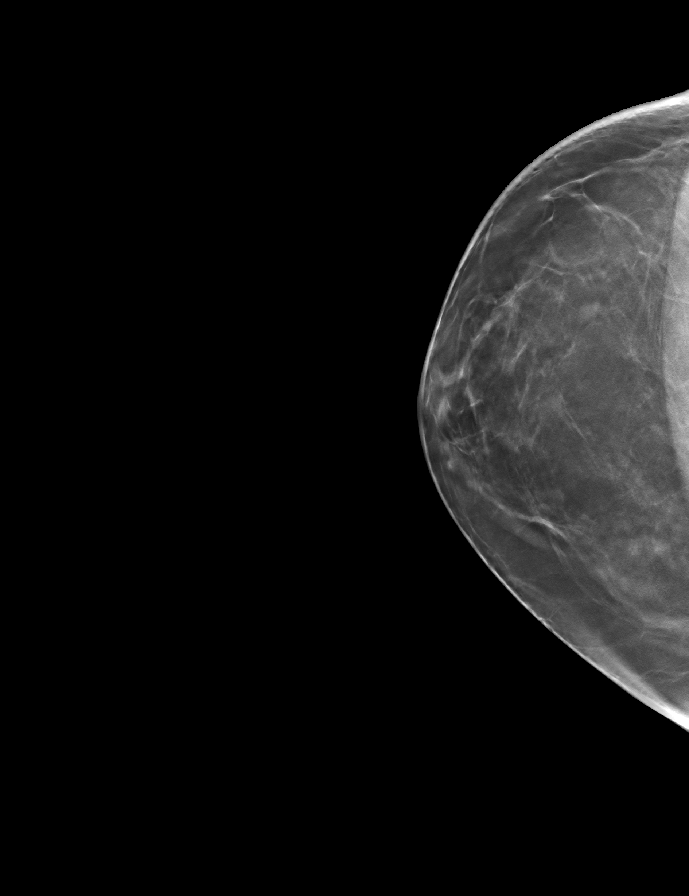
[frame 39/78]
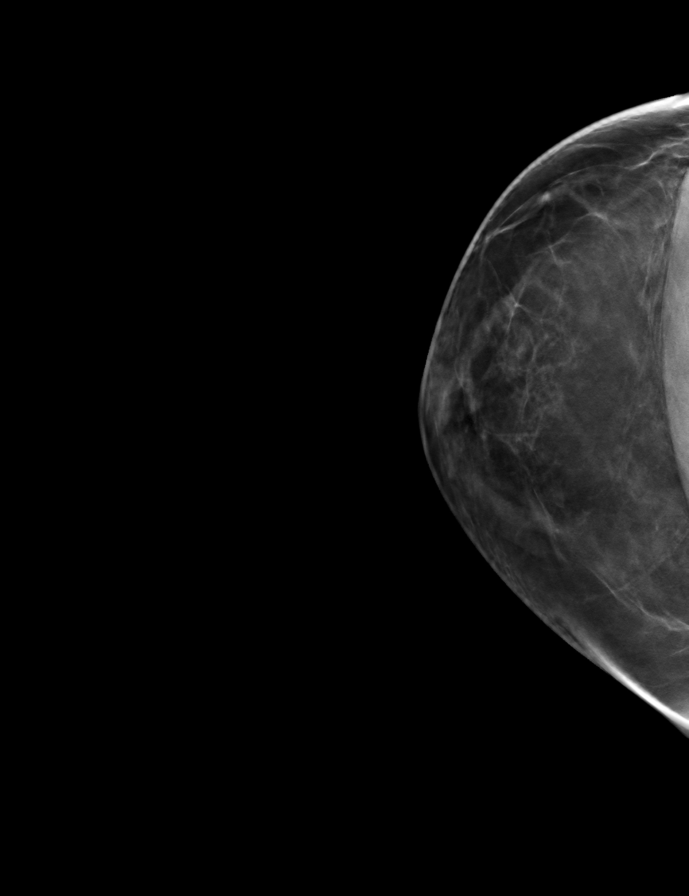

[L MLO tomo · tomo slice 37/73.0]
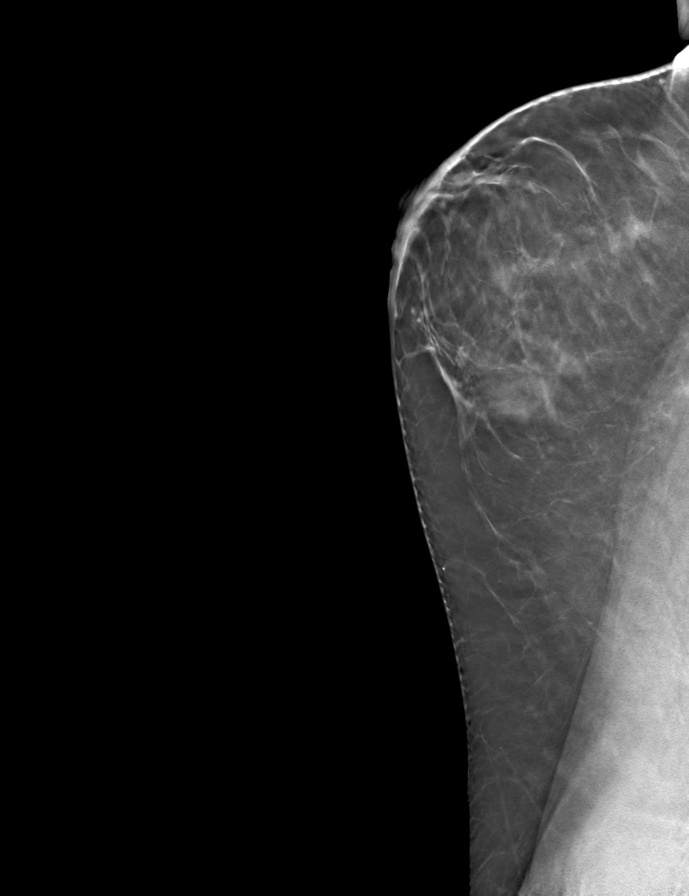

[R MLO tomo · tomo slice 39/78.0]
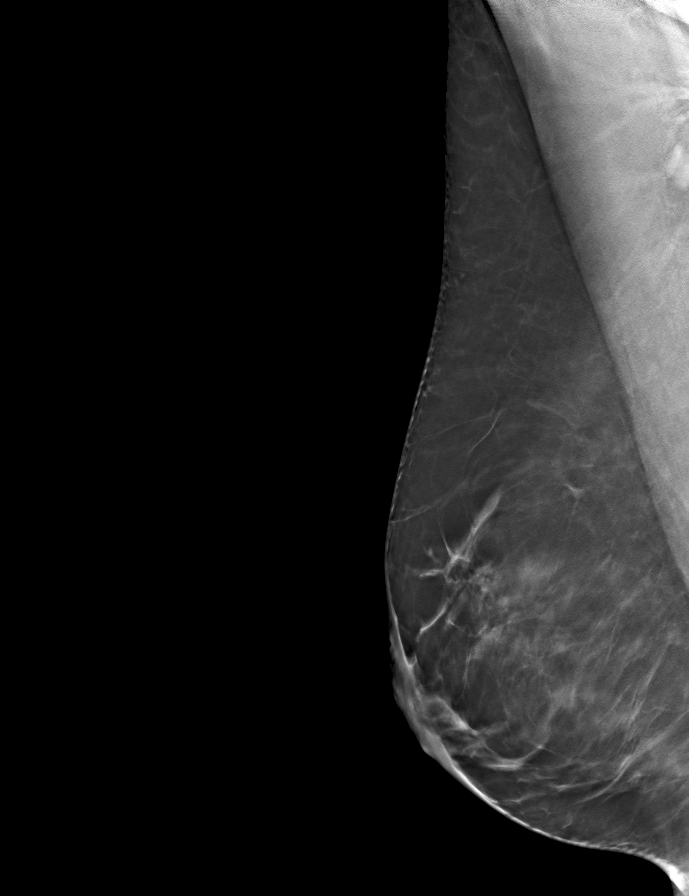

[R CC tomo · tomo slice 36/71.0]
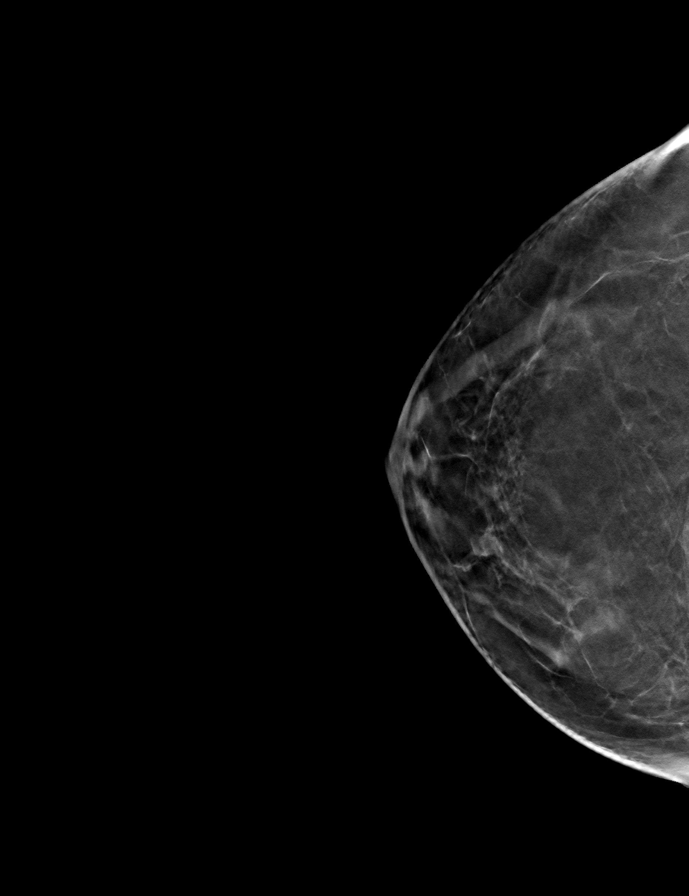

[9 of 24 positions shown; findings below may reference images not displayed]

ACR Breast Density Category b: There are scattered areas of
fibroglandular density.
FINDINGS: There are no findings suspicious for malignancy. Images were
processed with CAD.
IMPRESSION: No mammographic evidence of malignancy. A result letter of this
screening mammogram will be mailed directly to the patient.

RECOMMENDATION:
Screening mammogram in one year. (Code:[id number])

BI-RADS CATEGORY  1: Negative.

## 2021-08-29 DIAGNOSIS — E7800 Pure hypercholesterolemia, unspecified: Secondary | ICD-10-CM | POA: Insufficient documentation

## 2021-08-29 NOTE — Patient Instructions (Signed)

## 2021-09-02 ENCOUNTER — Encounter: Payer: Self-pay | Admitting: Nurse Practitioner

## 2021-09-02 ENCOUNTER — Ambulatory Visit: Payer: BC Managed Care – PPO | Admitting: Nurse Practitioner

## 2021-09-02 VITALS — BP 85/52 | HR 76 | Temp 98.2°F | Wt 153.6 lb

## 2021-09-02 DIAGNOSIS — R8281 Pyuria: Secondary | ICD-10-CM

## 2021-09-02 DIAGNOSIS — Z23 Encounter for immunization: Secondary | ICD-10-CM | POA: Diagnosis not present

## 2021-09-02 DIAGNOSIS — Z7689 Persons encountering health services in other specified circumstances: Secondary | ICD-10-CM

## 2021-09-02 DIAGNOSIS — R300 Dysuria: Secondary | ICD-10-CM | POA: Insufficient documentation

## 2021-09-02 DIAGNOSIS — Z2821 Immunization not carried out because of patient refusal: Secondary | ICD-10-CM | POA: Insufficient documentation

## 2021-09-02 DIAGNOSIS — E7800 Pure hypercholesterolemia, unspecified: Secondary | ICD-10-CM | POA: Diagnosis not present

## 2021-09-02 LAB — URINALYSIS, ROUTINE W REFLEX MICROSCOPIC
Bilirubin, UA: NEGATIVE
Glucose, UA: NEGATIVE
Ketones, UA: NEGATIVE
Nitrite, UA: NEGATIVE
Protein,UA: NEGATIVE
RBC, UA: NEGATIVE
Specific Gravity, UA: >1.03 — ABNORMAL HIGH (ref 1.005–1.030)
Urobilinogen, Ur: 0.2 mg/dL (ref 0.2–1.0)
pH, UA: 5.5 (ref 5.0–7.5)

## 2021-09-02 LAB — WET PREP FOR TRICH, YEAST, CLUE
Clue Cell Exam: NEGATIVE
Trichomonas Exam: NEGATIVE
Yeast Exam: NEGATIVE

## 2021-09-02 LAB — MICROSCOPIC EXAMINATION: Bacteria, UA: NONE SEEN

## 2021-09-02 NOTE — Assessment & Plan Note (Signed)
Noted on past labs -- will plan on continuing diet focus and recheck levels at physical.

## 2021-09-02 NOTE — Progress Notes (Signed)
New Patient Office Visit  Subjective    Patient ID: Kelly Fleming, female    DOB: 05/14/1965  Age: 56 y.o. MRN: [id number]  CC:  Chief Complaint  Patient presents with   New Patient (Initial Visit)    Patient is here to establish care.    Urinary Tract Infection    Patient says she first noticed the symptoms for about 3 days and she says she has tried over the counter Azo. Patient says she feels as she is getting pain, but not sure. Patient says she still symptomatic. Patient says the best antibiotic that helps her is Cipro.     HPI Kelly Fleming presents for new patient visit to establish care.  Introduced to Publishing rights manager role and practice setting.  All questions answered.  Discussed provider/patient relationship and expectations.  Husband attends practice and decided to switch over here.  URINARY SYMPTOMS Started about 3 days ago, came home from business trip and self medicated with Azo -- has not cleared as of yet.  Last UTI was 6 months ago -- with aging having more vaginal dryness.  She is allergic Penicillin, Sulfa, and Macrobid. Dysuria: burning Urinary frequency: yes Urgency: yes Small volume voids: yes Symptom severity: yes Urinary incontinence: no Foul odor: yes Hematuria: no Abdominal pain: no Back pain: no Suprapubic pain/pressure: yes Flank pain: no Fever:  no Vomiting: no Relief with pyridium: no Status: stable Previous urinary tract infection: yes Recurrent urinary tract infection: no Sexual activity: monogamous History of sexually transmitted disease: no Treatments attempted: pyridium and increasing fluids    Outpatient Encounter Medications as of 09/02/2021  Medication Sig   OVER THE COUNTER MEDICATION Eye ointment; once daily at bedtime   XIIDRA 5 % SOLN INSTILL 1 DROP OU BID   Cholecalciferol 1.25 MG (50000 UT) capsule Take 1 capsule by mouth once a week. (Patient not taking: Reported on 09/02/2021)   [DISCONTINUED] ciprofloxacin  (CIPRO) 500 MG tablet Take 1 tablet (500 mg total) by mouth 2 (two) times daily.   [DISCONTINUED] ciprofloxacin (CIPRO) 500 MG tablet Take 1 tablet (500 mg total) by mouth 2 (two) times daily.   [DISCONTINUED] nitrofurantoin, macrocrystal-monohydrate, (MACROBID) 100 MG capsule Take 1 capsule (100 mg total) by mouth 2 (two) times daily.   No facility-administered encounter medications on file as of 09/02/2021.    Past Medical History:  Diagnosis Date   Childhood asthma    Dry eyes    GERD (gastroesophageal reflux disease)     Past Surgical History:  Procedure Laterality Date   COLONOSCOPY WITH PROPOFOL N/A 11/25/2016   Procedure: COLONOSCOPY WITH PROPOFOL;  Surgeon: Kelly Minium, MD;  Location: [facility];  Service: Endoscopy;  Laterality: N/A;   SALPINGECTOMY  2010    Family History  Problem Relation Age of Onset   Glaucoma Mother    Alzheimer's disease Mother    Dementia Mother    Diabetes Father    Lung cancer Father    Brain cancer Father    Throat cancer Brother    Healthy Son    Retinal detachment Paternal Aunt    Diabetes Maternal Grandfather    Hyperlipidemia Maternal Grandfather    Diabetes Paternal Grandfather    Hyperlipidemia Paternal Grandfather    Breast cancer Cousin        mat cousin   Ovarian cancer Neg Hx    Colon cancer Neg Hx     Social History   Socioeconomic History   Marital status: Married    Spouse  name: Not on file   Number of children: Not on file   Years of education: Not on file   Highest education level: Not on file  Occupational History   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[employer]    Comment: [employer] [place]  Tobacco Use   Smoking status: Never   Smokeless tobacco: Never  Vaping Use   Vaping Use: Never used  Substance and Sexual Activity   Alcohol use: No   Drug use: No   Sexual activity: Yes    Birth control/protection: None  Other Topics Concern   Not on file  Social History Narrative   Not on file   Social  Determinants of Health   Financial Resource Strain: Low Risk  (09/02/2021)   Overall Financial Resource Strain (CARDIA)    Difficulty of Paying Living Expenses: Not hard at all  Food Insecurity: No Food Insecurity (09/02/2021)   Hunger Vital Sign    Worried About Running Out of Food in the Last Year: Never true    Ran Out of Food in the Last Year: Never true  Transportation Needs: No Transportation Needs (09/02/2021)   PRAPARE - Administrator, Civil Service (Medical): No    Lack of Transportation (Non-Medical): No  Physical Activity: Sufficiently Active (09/02/2021)   Exercise Vital Sign    Days of Exercise per Week: 5 days    Minutes of Exercise per Session: 30 min  Stress: No Stress Concern Present (09/02/2021)   [employer] of Occupational Health - Occupational Stress Questionnaire    Feeling of Stress : Only a little  Social Connections: Moderately Isolated (09/02/2021)   Social Connection and Isolation Panel [NHANES]    Frequency of Communication with Friends and Family: Three times a week    Frequency of Social Gatherings with Friends and Family: Three times a week    Attends Religious Services: Never    Active Member of Clubs or Organizations: No    Attends Banker Meetings: Never    Marital Status: Married  Catering manager Violence: Not At Risk (09/02/2021)   Humiliation, Afraid, Rape, and Kick questionnaire    Fear of Current or Ex-Partner: No    Emotionally Abused: No    Physically Abused: No    Sexually Abused: No    Review of Systems  Constitutional:  Negative for chills, diaphoresis, fever and weight loss.  Respiratory:  Negative for cough, shortness of breath and wheezing.   Cardiovascular:  Negative for chest pain, palpitations, orthopnea and leg swelling.  Genitourinary:  Positive for dysuria, frequency and urgency. Negative for flank pain and hematuria.  Neurological: Negative.   Endo/Heme/Allergies: Negative.    Psychiatric/Behavioral: Negative.          Objective    BP (!) 85/52   Pulse 76   Temp 98.2 F (36.8 C) (Oral)   Wt 153 lb 9.6 oz (69.7 kg)   SpO2 96%   BMI 26.37 kg/m   Physical Exam Vitals and nursing note reviewed.  Constitutional:      General: She is awake. She is not in acute distress.    Appearance: She is well-developed and well-groomed. She is not ill-appearing or toxic-appearing.  HENT:     Head: Normocephalic.     Right Ear: Hearing normal.     Left Ear: Hearing normal.  Eyes:     General: Lids are normal.        Right eye: No discharge.        Left eye:  No discharge.     Conjunctiva/sclera: Conjunctivae normal.     Pupils: Pupils are equal, round, and reactive to light.  Neck:     Thyroid: No thyromegaly.     Vascular: No carotid bruit.  Cardiovascular:     Rate and Rhythm: Normal rate and regular rhythm.     Heart sounds: Normal heart sounds. No murmur heard.    No gallop.  Pulmonary:     Effort: Pulmonary effort is normal. No accessory muscle usage or respiratory distress.     Breath sounds: Normal breath sounds.  Abdominal:     General: Bowel sounds are normal. There is no distension.     Palpations: Abdomen is soft.     Tenderness: There is abdominal tenderness in the suprapubic area. There is no right CVA tenderness or left CVA tenderness.  Musculoskeletal:     Cervical back: Normal range of motion and neck supple.     Right lower leg: No edema.     Left lower leg: No edema.  Lymphadenopathy:     Cervical: No cervical adenopathy.  Skin:    General: Skin is warm and dry.  Neurological:     Mental Status: She is alert and oriented to person, place, and time.     Deep Tendon Reflexes: Reflexes are normal and symmetric.     Reflex Scores:      Brachioradialis reflexes are 2+ on the right side and 2+ on the left side.      Patellar reflexes are 2+ on the right side and 2+ on the left side. Psychiatric:        Attention and Perception:  Attention normal.        Mood and Affect: Mood normal.        Speech: Speech normal.        Behavior: Behavior normal. Behavior is cooperative.        Thought Content: Thought content normal.     Last metabolic panel Lab Results  Component Value Date   GLUCOSE 81 11/23/2017   NA 141 11/23/2017   K 4.7 11/23/2017   CL 101 11/23/2017   CO2 24 11/23/2017   BUN 10 11/23/2017   CREATININE 0.69 11/23/2017   GFRNONAA 101 11/23/2017   CALCIUM 9.9 11/23/2017   PROT 7.4 11/23/2017   ALBUMIN 4.8 11/23/2017   LABGLOB 2.6 11/23/2017   AGRATIO 1.8 11/23/2017   BILITOT 0.4 11/23/2017   ALKPHOS 50 11/23/2017   AST 23 11/23/2017   ALT 14 11/23/2017   Last lipids Lab Results  Component Value Date   CHOL 196 02/04/2020   HDL 44 02/04/2020   LDLCALC 137 (H) 02/04/2020   TRIG 84 02/04/2020   CHOLHDL 4.5 (H) 02/04/2020   Last hemoglobin A1c Lab Results  Component Value Date   HGBA1C 5.4 02/04/2020   Last thyroid functions Lab Results  Component Value Date   TSH 3.300 02/04/2020       Assessment & Plan:   Problem List Items Addressed This Visit       Other   Dysuria    Acute for 3 days with urine overall stable - trace LEUKS only.  Will send for culture to further assess.  Wet prep negative, will send urine for culture to further assess and treat as needed.  Discussed with patient, recommend she continue current at home care.  ?related to atrophic vaginitis, may need to consider estrogen cream.      Relevant Orders   Urinalysis, Routine w reflex  microscopic   WET PREP FOR TRICH, YEAST, CLUE   Elevated low density lipoprotein (LDL) cholesterol level - Primary    Noted on past labs -- will plan on continuing diet focus and recheck levels at physical.      Other Visit Diagnoses     Pyuria       Send urine for culture.   Relevant Orders   Urine Culture   Need for Td vaccine       Td vaccine today.   Relevant Orders   Td vaccine greater than or equal to 7yo  preservative free IM (Completed)   Encounter to establish care       Introduced to clinic setting and provider.       Return in about 6 weeks (around 10/14/2021) for Annual physical.   Marjie Skiff, NP

## 2021-09-02 NOTE — Assessment & Plan Note (Addendum)
Acute for 3 days with urine overall stable - trace LEUKS only.  Will send for culture to further assess.  Wet prep negative, will send urine for culture to further assess and treat as needed.  Discussed with patient, recommend she continue current at home care.  ?related to atrophic vaginitis, may need to consider estrogen cream.

## 2021-09-04 LAB — URINE CULTURE

## 2021-09-04 NOTE — Progress Notes (Signed)
Good morning Kelly Fleming, please let Kelly Fleming know her urine culture returned and no infection noted:)

## 2021-11-21 NOTE — Patient Instructions (Signed)

## 2021-11-24 ENCOUNTER — Encounter: Payer: Self-pay | Admitting: Nurse Practitioner

## 2021-11-24 ENCOUNTER — Ambulatory Visit (INDEPENDENT_AMBULATORY_CARE_PROVIDER_SITE_OTHER): Payer: BC Managed Care – PPO | Admitting: Nurse Practitioner

## 2021-11-24 VITALS — BP 101/62 | HR 64 | Temp 98.1°F | Ht 63.1 in | Wt 152.4 lb

## 2021-11-24 DIAGNOSIS — E7800 Pure hypercholesterolemia, unspecified: Secondary | ICD-10-CM

## 2021-11-24 DIAGNOSIS — R7301 Impaired fasting glucose: Secondary | ICD-10-CM

## 2021-11-24 DIAGNOSIS — E559 Vitamin D deficiency, unspecified: Secondary | ICD-10-CM

## 2021-11-24 DIAGNOSIS — Z0000 Encounter for general adult medical examination without abnormal findings: Secondary | ICD-10-CM

## 2021-11-24 DIAGNOSIS — Z2821 Immunization not carried out because of patient refusal: Secondary | ICD-10-CM

## 2021-11-24 DIAGNOSIS — H5213 Myopia, bilateral: Secondary | ICD-10-CM

## 2021-11-24 DIAGNOSIS — Z114 Encounter for screening for human immunodeficiency virus [HIV]: Secondary | ICD-10-CM

## 2021-11-24 DIAGNOSIS — Z1159 Encounter for screening for other viral diseases: Secondary | ICD-10-CM

## 2021-11-24 NOTE — Assessment & Plan Note (Addendum)
Noted on past labs, discussed with patient and educated.  ASCVD 1.7% -- continue diet and exercise focus, low risk.  Labs today.

## 2021-11-24 NOTE — Assessment & Plan Note (Signed)
Refuses vaccination today.

## 2021-11-24 NOTE — Progress Notes (Signed)
BP 101/62   Pulse 64   Temp 98.1 F (36.7 C) (Oral)   Ht 5' 3.1" (1.603 m)   Wt 152 lb 6.4 oz (69.1 kg)   SpO2 99%   BMI 26.91 kg/m    Subjective:    Patient ID: Kelly Fleming, female    DOB: 1965-12-04, 56 y.o.   MRN: [id number]  HPI: Kelly Fleming is a 56 y.o. female presenting on 11/24/2021 for comprehensive medical examination. Current medical complaints include:none  She currently lives with: husband Menopausal Symptoms: no  The 10-year ASCVD risk score (Arnett DK, et al., 2019) is: 1.7%   Values used to calculate the score:     Age: 24 years     Sex: Female     Is Non-Hispanic African American: No     Diabetic: No     Tobacco smoker: No     Systolic Blood Pressure: 99991111 mmHg     Is BP treated: No     HDL Cholesterol: 44 mg/dL     Total Cholesterol: 196 mg/dL     11/24/2021    1:02 PM 09/02/2021    8:30 AM  Depression screen PHQ 2/9  Decreased Interest 0 0  Down, Depressed, Hopeless 0 0  PHQ - 2 Score 0 0  Altered sleeping 0   Tired, decreased energy 1   Change in appetite 0   Feeling bad or failure about yourself  0   Trouble concentrating 0   Moving slowly or fidgety/restless 0   Suicidal thoughts 0   PHQ-9 Score 1   Difficult doing work/chores Not difficult at all        11/24/2021    1:02 PM 11/24/2021    1:14 PM  Fall Risk  Falls in the past year? 0 0  Was there an injury with Fall? 0 0  Fall Risk Category Calculator 0 0  Fall Risk Category Low Low  Patient Fall Risk Level Low fall risk Low fall risk  Patient at Risk for Falls Due to No Fall Risks No Fall Risks  Fall risk Follow up Falls evaluation completed Falls prevention discussed    Functional Status Survey: Is the patient deaf or have difficulty hearing?: No Does the patient have difficulty seeing, even when wearing glasses/contacts?: No Does the patient have difficulty concentrating, remembering, or making decisions?: No Does the patient have difficulty walking or climbing  stairs?: No Does the patient have difficulty dressing or bathing?: No Does the patient have difficulty doing errands alone such as visiting a doctor's office or shopping?: No    Past Medical History:  Past Medical History:  Diagnosis Date   Childhood asthma    Dry eyes    GERD (gastroesophageal reflux disease)     Surgical History:  Past Surgical History:  Procedure Laterality Date   COLONOSCOPY WITH PROPOFOL N/A 11/25/2016   Procedure: COLONOSCOPY WITH PROPOFOL;  Surgeon: Lucilla Lame, MD;  Location: [facility];  Service: Endoscopy;  Laterality: N/A;   SALPINGECTOMY  2010    Medications:  Current Outpatient Medications on File Prior to Visit  Medication Sig   OVER THE COUNTER MEDICATION Eye ointment; once daily at bedtime   XIIDRA 5 % SOLN INSTILL 1 DROP OU BID   No current facility-administered medications on file prior to visit.    Allergies:  Allergies  Allergen Reactions   Fish Allergy Anaphylaxis    Freshwater fish cause asthma symptoms. 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[place] [employer]    Comment: [place]  Tobacco Use   Smoking status: Never   Smokeless tobacco: Never  Vaping Use   Vaping Use: Never used  Substance and Sexual Activity   Alcohol use: No   Drug use: No   Sexual activity: Yes    Birth control/protection: None  Other Topics Concern   Not on file  Social History Narrative   Not on file   Social Determinants of Health   Financial Resource Strain: Low Risk  (09/02/2021)   Overall Financial Resource Strain (CARDIA)    Difficulty of Paying Living Expenses: Not hard at all  Food  Insecurity: No Food Insecurity (09/02/2021)   Hunger Vital Sign    Worried About Running Out of Food in the Last Year: Never true    Ran Out of Food in the Last Year: Never true  Transportation Needs: No Transportation Needs (09/02/2021)   PRAPARE - Administrator, Civil Service (Medical): No    Lack of Transportation (Non-Medical): No  Physical Activity: Sufficiently Active (09/02/2021)   Exercise Vital Sign    Days of Exercise per Week: 5 days    Minutes of Exercise per Session: 30 min  Stress: No Stress Concern Present (09/02/2021)   [employer] of Occupational Health - Occupational Stress Questionnaire    Feeling of Stress : Only a little  Social Connections: Moderately Isolated (09/02/2021)   Social Connection and Isolation Panel [NHANES]    Frequency of Communication with Friends and Family: Three times a week    Frequency of Social Gatherings with Friends and Family: Three times a week    Attends Religious Services: Never    Active Member of Clubs or Organizations: No    Attends Banker Meetings: Never    Marital Status: Married  Catering manager Violence: Not At Risk (09/02/2021)   Humiliation, Afraid, Rape, and Kick questionnaire    Fear of Current or Ex-Partner: No    Emotionally Abused: No    Physically Abused: No    Sexually Abused: No   Social History   Tobacco Use  Smoking Status Never  Smokeless Tobacco Never   Social History   Substance and Sexual Activity  Alcohol Use No    Family History:  Family History  Problem Relation Age of Onset   Glaucoma Mother    Alzheimer's disease Mother    Dementia Mother    Diabetes Father    Lung cancer Father    Brain cancer Father    Throat cancer Brother    Healthy Son    Retinal detachment Paternal Aunt    Diabetes Maternal Grandfather    Hyperlipidemia Maternal Grandfather    Diabetes Paternal Grandfather    Hyperlipidemia Paternal Grandfather    Breast cancer Cousin        mat  cousin   Ovarian cancer Neg Hx    Colon cancer Neg Hx     Past medical history, surgical history, medications, allergies, family history and social history reviewed with patient today and changes made to appropriate areas of the chart.  ROS All other ROS negative except what is listed above and in the HPI.      Objective:    BP 101/62   Pulse 64   Temp 98.1 F (36.7 C) (Oral)   Ht 5' 3.1" (1.603 m)   Wt 152 lb 6.4 oz (69.1 kg)   SpO2 99%   BMI 26.91 kg/m   Wt Readings from Last 3 Encounters:  11/24/21 152 lb 6.4 oz (69.1 kg)  09/02/21 153 lb 9.6 oz (69.7 kg)  09/22/20 146 lb (66.2 kg)    Physical Exam Constitutional:      General: She is awake. She is not in acute distress.    Appearance: She is well-developed. She is not ill-appearing.  HENT:     Head: Normocephalic and atraumatic.     Right Ear: Hearing, tympanic membrane, ear canal and external ear normal. No drainage.     Left Ear: Hearing, tympanic membrane, ear canal and external ear normal. No drainage.     Nose: Nose normal.     Right Sinus: No maxillary sinus tenderness or frontal sinus tenderness.     Left Sinus: No maxillary sinus tenderness or frontal sinus tenderness.     Mouth/Throat:     Mouth: Mucous membranes are moist.     Pharynx: Oropharynx is clear. Uvula midline. No pharyngeal swelling, oropharyngeal exudate or posterior oropharyngeal erythema.  Eyes:     General: Lids are normal.        Right eye: No discharge.        Left eye: No discharge.     Extraocular Movements: Extraocular movements intact.     Conjunctiva/sclera: Conjunctivae normal.     Pupils: Pupils are equal, round, and reactive to light.     Visual Fields: Right eye visual fields normal and left eye visual fields normal.  Neck:     Thyroid: No thyromegaly.     Vascular: No carotid bruit.     Trachea: Trachea normal.  Cardiovascular:     Rate and Rhythm: Normal rate and regular rhythm.     Heart sounds: Normal heart sounds. No  murmur heard.    No gallop.  Pulmonary:     Effort: Pulmonary effort is normal. No accessory muscle usage or respiratory distress.     Breath sounds: Normal breath sounds.  Abdominal:     General: Bowel sounds are normal.     Palpations: Abdomen is soft. There is no hepatomegaly or splenomegaly.     Tenderness: There is no abdominal tenderness.  Musculoskeletal:        General: Normal range of motion.     Cervical back: Normal range of motion and neck supple.     Right lower leg: No edema.     Left lower leg: No edema.  Lymphadenopathy:     Head:     Right side of head: No submental, submandibular, tonsillar, preauricular or posterior auricular adenopathy.     Left side of head: No submental, submandibular, tonsillar, preauricular or posterior auricular adenopathy.     Cervical: No cervical adenopathy.  Skin:    General: Skin is warm and dry.     Capillary Refill: Capillary refill takes less than 2 seconds.     Findings: No rash.  Neurological:     Mental Status: She is alert and oriented to person, place, and time.     Gait: Gait is intact.     Deep Tendon Reflexes: Reflexes are normal and symmetric.     Reflex  Scores:      Brachioradialis reflexes are 2+ on the right side and 2+ on the left side.      Patellar reflexes are 2+ on the right side and 2+ on the left side. Psychiatric:        Attention and Perception: Attention normal.        Mood and Affect: Mood normal.        Speech: Speech normal.        Behavior: Behavior normal. Behavior is cooperative.        Thought Content: Thought content normal.        Judgment: Judgment normal.     Results for orders placed or performed in visit on 09/02/21  Urine Culture   Specimen: Urine   UR  Result Value Ref Range   Urine Culture, Routine Final report    Organism ID, Bacteria Comment   WET PREP FOR TRICH, YEAST, CLUE   Specimen: Sterile Swab   Sterile Swab  Result Value Ref Range   Trichomonas Exam Negative Negative    Yeast Exam Negative Negative   Clue Cell Exam Negative Negative  Microscopic Examination   Urine  Result Value Ref Range   WBC, UA 6-10 (A) 0 - 5 /hpf   RBC, Urine 0-2 0 - 2 /hpf   Epithelial Cells (non renal) 0-10 0 - 10 /hpf   Bacteria, UA None seen None seen/Few  Urinalysis, Routine w reflex microscopic  Result Value Ref Range   Specific Gravity, UA >1.030 (H) 1.005 - 1.030   pH, UA 5.5 5.0 - 7.5   Color, UA Yellow Yellow   Appearance Ur Clear Clear   Leukocytes,UA Trace (A) Negative   Protein,UA Negative Negative/Trace   Glucose, UA Negative Negative   Ketones, UA Negative Negative   RBC, UA Negative Negative   Bilirubin, UA Negative Negative   Urobilinogen, Ur 0.2 0.2 - 1.0 mg/dL   Nitrite, UA Negative Negative   Microscopic Examination See below:       Assessment & Plan:   Problem List Items Addressed This Visit       Other   Elevated low density lipoprotein (LDL) cholesterol level - Primary (Chronic)    Noted on past labs, discussed with patient and educated.  ASCVD 1.7% -- continue diet and exercise focus, low risk.  Labs today.      Relevant Orders   Comprehensive metabolic panel   Lipid Panel w/o Chol/HDL Ratio   Myopia of both eyes    Continue collaboration with eye doctor.      Refused influenza vaccine    Refuses vaccination today.      Other Visit Diagnoses     Vitamin D deficiency       History of low levels, check today and start supplement as needed.   Relevant Orders   VITAMIN D 25 Hydroxy (Vit-D Deficiency, Fractures)   IFG (impaired fasting glucose)       Check A1c today, previous has been normal and stable.   Relevant Orders   HgB A1c   Need for hepatitis C screening test       Hep C screen on labs today per guidelines for one time screening, discussed with patient.   Relevant Orders   Hepatitis C antibody   Encounter for screening for HIV       HIV screen on labs today per guidelines for one time screening, discussed with patient.    Relevant Orders   HIV Antibody (routine testing w  rflx)   Encounter for annual physical exam       Annual physical today with labs and health maintenance reviewed, discussed with patient.   Relevant Orders   CBC with Differential/Platelet   TSH        Follow up plan: Return in about 1 year (around 11/25/2022) for Annual physical.   LABORATORY TESTING:  - Pap smear: up to date  IMMUNIZATIONS:   - Tdap: Tetanus vaccination status reviewed: last tetanus booster within 10 years. - Influenza: Refused - Pneumovax: Not applicable - Prevnar: Not applicable - COVID: Refused - HPV: Not applicable - Shingrix vaccine: Refused  SCREENING: -Mammogram: Up to date  - Colonoscopy: Up to date  - Bone Density: Not applicable  -Hearing Test: Not applicable  -Spirometry: Not applicable   PATIENT COUNSELING:   Advised to take 1 mg of folate supplement per day if capable of pregnancy.   Sexuality: Discussed sexually transmitted diseases, partner selection, use of condoms, avoidance of unintended pregnancy  and contraceptive alternatives.   Advised to avoid cigarette smoking.  I discussed with the patient that most people either abstain from alcohol or drink within safe limits (<=14/week and <=4 drinks/occasion for males, <=7/weeks and <= 3 drinks/occasion for females) and that the risk for alcohol disorders and other health effects rises proportionally with the number of drinks per week and how often a drinker exceeds daily limits.  Discussed cessation/primary prevention of drug use and availability of treatment for abuse.   Diet: Encouraged to adjust caloric intake to maintain  or achieve ideal body weight, to reduce intake of dietary saturated fat and total fat, to limit sodium intake by avoiding high sodium foods and not adding table salt, and to maintain adequate dietary potassium and calcium preferably from fresh fruits, vegetables, and low-fat dairy products.    Stressed the  importance of regular exercise  Injury prevention: Discussed safety belts, safety helmets, smoke detector, smoking near bedding or upholstery.   Dental health: Discussed importance of regular tooth brushing, flossing, and dental visits.    NEXT PREVENTATIVE PHYSICAL DUE IN 1 YEAR. Return in about 1 year (around 11/25/2022) for Annual physical.

## 2021-11-24 NOTE — Assessment & Plan Note (Signed)
Continue collaboration with eye doctor.

## 2021-11-25 NOTE — Progress Notes (Signed)
Good morning, please let Orvetta know her labs have returned and are overall stable with exception of Vitamin D is a bit low at 18.4, would like to see above 30.  Please ensure to take Vitamin D3 2000 units daily for overall bone health.  Cholesterol levels remain a bit elevated, but as we discussed yesterday you can continue to monitor diet changes and regular exercise.  We will recheck in one year.  Any questions? Keep being amazing!!  Thank you for allowing me to participate in your care.  I appreciate you. Kindest regards, Aman Bonet

## 2021-11-27 LAB — CBC WITH DIFFERENTIAL/PLATELET
Basophils Absolute: 0.1 10*3/uL (ref 0.0–0.2)
Basos: 1 %
EOS (ABSOLUTE): 0.1 10*3/uL (ref 0.0–0.4)
Eos: 1 %
Hematocrit: 40.3 % (ref 34.0–46.6)
Hemoglobin: 13.3 g/dL (ref 11.1–15.9)
Immature Grans (Abs): 0 10*3/uL (ref 0.0–0.1)
Immature Granulocytes: 0 %
Lymphocytes Absolute: 2.3 10*3/uL (ref 0.7–3.1)
Lymphs: 23 %
MCH: 30.2 pg (ref 26.6–33.0)
MCHC: 33 g/dL (ref 31.5–35.7)
MCV: 91 fL (ref 79–97)
Monocytes Absolute: 0.6 10*3/uL (ref 0.1–0.9)
Monocytes: 6 %
Neutrophils Absolute: 7.3 10*3/uL — ABNORMAL HIGH (ref 1.4–7.0)
Neutrophils: 69 %
Platelets: 253 10*3/uL (ref 150–450)
RBC: 4.41 x10E6/uL (ref 3.77–5.28)
RDW: 12.2 % (ref 11.7–15.4)
WBC: 10.4 10*3/uL (ref 3.4–10.8)

## 2021-11-27 LAB — HEPATITIS C ANTIBODY: Hep C Virus Ab: NONREACTIVE

## 2021-11-27 LAB — COMPREHENSIVE METABOLIC PANEL WITH GFR
ALT: 14 [IU]/L (ref 0–32)
AST: 17 [IU]/L (ref 0–40)
Albumin/Globulin Ratio: 1.8 (ref 1.2–2.2)
Albumin: 4.5 g/dL (ref 3.8–4.9)
Alkaline Phosphatase: 62 [IU]/L (ref 44–121)
BUN/Creatinine Ratio: 11 (ref 9–23)
BUN: 9 mg/dL (ref 6–24)
Bilirubin Total: <0.2 mg/dL (ref 0.0–1.2)
CO2: 22 mmol/L (ref 20–29)
Calcium: 9 mg/dL (ref 8.7–10.2)
Chloride: 105 mmol/L (ref 96–106)
Creatinine, Ser: 0.79 mg/dL (ref 0.57–1.00)
Globulin, Total: 2.5 g/dL (ref 1.5–4.5)
Glucose: 91 mg/dL (ref 70–99)
Potassium: 4 mmol/L (ref 3.5–5.2)
Sodium: 139 mmol/L (ref 134–144)
Total Protein: 7 g/dL (ref 6.0–8.5)
eGFR: 88 mL/min/{1.73_m2}

## 2021-11-27 LAB — LIPID PANEL W/O CHOL/HDL RATIO
Cholesterol, Total: 238 mg/dL — ABNORMAL HIGH (ref 100–199)
HDL: 58 mg/dL
LDL Chol Calc (NIH): 158 mg/dL — ABNORMAL HIGH (ref 0–99)
Triglycerides: 123 mg/dL (ref 0–149)
VLDL Cholesterol Cal: 22 mg/dL (ref 5–40)

## 2021-11-27 LAB — HIV 1/2 AB DIFFERENTIATION
HIV 1 Ab: NONREACTIVE
HIV 2 Ab: NONREACTIVE
NOTE (HIV CONF MULTIP: NEGATIVE

## 2021-11-27 LAB — HIV-1/HIV-2 QUALITATIVE RNA
Final Interpretation: NEGATIVE
HIV-1 RNA, Qualitative: NONREACTIVE
HIV-2 RNA, Qualitative: NONREACTIVE

## 2021-11-27 LAB — HIV ANTIBODY (ROUTINE TESTING W REFLEX): HIV Screen 4th Generation wRfx: REACTIVE

## 2021-11-27 LAB — VITAMIN D 25 HYDROXY (VIT D DEFICIENCY, FRACTURES): Vit D, 25-Hydroxy: 18.9 ng/mL — ABNORMAL LOW (ref 30.0–100.0)

## 2021-11-27 LAB — HEMOGLOBIN A1C
Est. average glucose Bld gHb Est-mCnc: 105 mg/dL
Hgb A1c MFr Bld: 5.3 % (ref 4.8–5.6)

## 2021-11-27 LAB — TSH: TSH: 2.43 u[IU]/mL (ref 0.450–4.500)

## 2021-11-27 NOTE — Progress Notes (Signed)
Good morning, please let Kelly Fleming know her HIV and Hep C screening tests returned negative:)

## 2022-12-04 NOTE — Patient Instructions (Signed)
Be Involved in Caring For Your Health:  Taking Medications When medications are taken as directed, they can greatly improve your health. But if they are not taken as prescribed, they may not work. In some cases, not taking them correctly can be harmful. To help ensure your treatment remains effective and safe, understand your medications and how to take them. Bring your medications to each visit for review by your provider.  Your lab results, notes, and after visit summary will be available on My Chart. We strongly encourage you to use this feature. If lab results are abnormal the clinic will contact you with the appropriate steps. If the clinic does not contact you assume the results are satisfactory. You can always view your results on My Chart. If you have questions regarding your health or results, please contact the clinic during office hours. You can also ask questions on My Chart.  We at [facility] are grateful that you chose [place] to provide your care. We strive to provide evidence-based and compassionate care and are always looking for feedback. If you get a survey from the clinic please complete this so we can hear your opinions.  Healthy Eating, Adult Healthy eating may help you get and keep a healthy body weight, reduce the risk of chronic disease, and live a long and productive life. It is important to follow a healthy eating pattern. Your nutritional and calorie needs should be met mainly by different nutrient-rich foods. What are tips for following this plan? Reading food labels Read labels and choose the following: Reduced or low sodium products. Juices with 100% fruit juice. Foods with low saturated fats (<3 g per serving) and high polyunsaturated and monounsaturated fats. Foods with whole grains, such as whole wheat, cracked wheat, brown rice, and wild rice. Whole grains that are fortified with folic acid. This is recommended for females who are pregnant or who want to  become pregnant. Read labels and do not eat or drink the following: Foods or drinks with added sugars. These include foods that contain brown sugar, corn sweetener, corn syrup, dextrose, fructose, glucose, high-fructose corn syrup, honey, invert sugar, lactose, malt syrup, maltose, molasses, raw sugar, sucrose, trehalose, or turbinado sugar. Limit your intake of added sugars to less than 10% of your total daily calories. Do not eat more than the following amounts of added sugar per day: 6 teaspoons (25 g) for females. 9 teaspoons (38 g) for males. Foods that contain processed or refined starches and grains. Refined grain products, such as white flour, degermed cornmeal, white bread, and white rice. Shopping Choose nutrient-rich snacks, such as vegetables, whole fruits, and nuts. Avoid high-calorie and high-sugar snacks, such as potato chips, fruit snacks, and candy. Use oil-based dressings and spreads on foods instead of solid fats such as butter, margarine, sour cream, or cream cheese. Limit pre-made sauces, mixes, and "instant" products such as flavored rice, instant noodles, and ready-made pasta. Try more plant-protein sources, such as tofu, tempeh, black beans, edamame, lentils, nuts, and seeds. Explore eating plans such as the Mediterranean diet or vegetarian diet. Try heart-healthy dips made with beans and healthy fats like hummus and guacamole. Vegetables go great with these. Cooking Use oil to saut or stir-fry foods instead of solid fats such as butter, margarine, or lard. Try baking, boiling, grilling, or broiling instead of frying. Remove the fatty part of meats before cooking. Steam vegetables in water or broth. Meal planning  At meals, imagine dividing your plate into fourths: One-half of  your plate is fruits and vegetables. One-fourth of your plate is whole grains. One-fourth of your plate is protein, especially lean meats, poultry, eggs, tofu, beans, or nuts. Include low-fat  dairy as part of your daily diet. Lifestyle Choose healthy options in all settings, including home, work, school, restaurants, or stores. Prepare your food safely: Wash your hands after handling raw meats. Where you prepare food, keep surfaces clean by regularly washing with hot, soapy water. Keep raw meats separate from ready-to-eat foods, such as fruits and vegetables. Cook seafood, meat, poultry, and eggs to the recommended temperature. Get a food thermometer. Store foods at safe temperatures. In general: Keep cold foods at 48F (4.4C) or below. Keep hot foods at 148F (60C) or above. Keep your freezer at [facility] (-17.8C) or below. Foods are not safe to eat if they have been between the temperatures of 40-148F (4.4-60C) for more than 2 hours. What foods should I eat? Fruits Aim to eat 1-2 cups of fresh, canned (in natural juice), or frozen fruits each day. One cup of fruit equals 1 small apple, 1 large banana, 8 large strawberries, 1 cup (237 g) canned fruit,  cup (82 g) dried fruit, or 1 cup (240 mL) 100% juice. Vegetables Aim to eat 2-4 cups of fresh and frozen vegetables each day, including different varieties and colors. One cup of vegetables equals 1 cup (91 g) broccoli or cauliflower florets, 2 medium carrots, 2 cups (150 g) raw, leafy greens, 1 large tomato, 1 large bell pepper, 1 large sweet potato, or 1 medium white potato. Grains Aim to eat 5-10 ounce-equivalents of whole grains each day. Examples of 1 ounce-equivalent of grains include 1 slice of bread, 1 cup (40 g) ready-to-eat cereal, 3 cups (24 g) popcorn, or  cup (93 g) cooked rice. Meats and other proteins Try to eat 5-7 ounce-equivalents of protein each day. Examples of 1 ounce-equivalent of protein include 1 egg,  oz nuts (12 almonds, 24 pistachios, or 7 walnut halves), 1/4 cup (90 g) cooked beans, 6 tablespoons (90 g) hummus or 1 tablespoon (16 g) peanut butter. A cut of meat or fish that is the size of a deck of  cards is about 3-4 ounce-equivalents (85 g). Of the protein you eat each week, try to have at least 8 sounce (227 g) of seafood. This is about 2 servings per week. This includes salmon, trout, herring, sardines, and anchovies. Dairy Aim to eat 3 cup-equivalents of fat-free or low-fat dairy each day. Examples of 1 cup-equivalent of dairy include 1 cup (240 mL) milk, 8 ounces (250 g) yogurt, 1 ounces (44 g) natural cheese, or 1 cup (240 mL) fortified soy milk. Fats and oils Aim for about 5 teaspoons (21 g) of fats and oils per day. Choose monounsaturated fats, such as canola and olive oils, mayonnaise made with olive oil or avocado oil, avocados, peanut butter, and most nuts, or polyunsaturated fats, such as sunflower, corn, and soybean oils, walnuts, pine nuts, sesame seeds, sunflower seeds, and flaxseed. Beverages Aim for 6 eight-ounce glasses of water per day. Limit coffee to 3-5 eight-ounce cups per day. Limit caffeinated beverages that have added calories, such as soda and energy drinks. If you drink alcohol: Limit how much you have to: 0-1 drink a day if you are female. 0-2 drinks a day if you are female. Know how much alcohol is in your drink. In the [place] one drink is one 12 oz bottle of beer (355 mL), one 5 oz glass of wine (  148 mL), or one 1 oz glass of hard liquor (44 mL). Seasoning and other foods Try not to add too much salt to your food. Try using herbs and spices instead of salt. Try not to add sugar to food. This information is based on [place] nutrition guidelines. To learn more, visit [employer]. Exact amounts may vary. You may need different amounts. This information is not intended to replace advice given to you by your health care provider. Make sure you discuss any questions you have with your health care provider. Document Revised: 09/27/2021 Document Reviewed: 09/27/2021 Elsevier Patient Education  2024 [employer].

## 2022-12-07 ENCOUNTER — Encounter: Payer: Self-pay | Admitting: Nurse Practitioner

## 2022-12-07 ENCOUNTER — Ambulatory Visit (INDEPENDENT_AMBULATORY_CARE_PROVIDER_SITE_OTHER): Payer: BC Managed Care – PPO | Admitting: Nurse Practitioner

## 2022-12-07 VITALS — BP 103/64 | HR 61 | Temp 98.1°F | Ht 62.9 in | Wt 152.6 lb

## 2022-12-07 DIAGNOSIS — Z0000 Encounter for general adult medical examination without abnormal findings: Secondary | ICD-10-CM

## 2022-12-07 DIAGNOSIS — E7800 Pure hypercholesterolemia, unspecified: Secondary | ICD-10-CM

## 2022-12-07 DIAGNOSIS — H5213 Myopia, bilateral: Secondary | ICD-10-CM

## 2022-12-07 DIAGNOSIS — Z2821 Immunization not carried out because of patient refusal: Secondary | ICD-10-CM | POA: Diagnosis not present

## 2022-12-07 DIAGNOSIS — E559 Vitamin D deficiency, unspecified: Secondary | ICD-10-CM

## 2022-12-07 NOTE — Assessment & Plan Note (Signed)
Refuses flu vaccine, educated on this.

## 2022-12-07 NOTE — Assessment & Plan Note (Signed)
Noted on past labs, discussed with patient and educated.  ASCVD 1.8% -- continue diet and exercise focus, low risk.  Labs today.

## 2022-12-07 NOTE — Progress Notes (Signed)
BP 103/64   Pulse 61   Temp 98.1 F (36.7 C) (Oral)   Ht 5' 2.9" (1.598 m)   Wt 152 lb 9.6 oz (69.2 kg)   LMP  (LMP Unknown)   SpO2 98%   BMI 27.12 kg/m    Subjective:    Patient ID: Kelly Fleming, female    DOB: 1965/04/23, 57 y.o.   MRN: [id number]  HPI: Kelly Fleming is a 57 y.o. female presenting on 12/07/2022 for comprehensive medical examination. Current medical complaints include:none  She currently lives with: husband Menopausal Symptoms: no  The 10-year ASCVD risk score (Arnett DK, et al., 2019) is: 1.8%   Values used to calculate the score:     Age: 28 years     Sex: Female     Is Non-Hispanic African American: No     Diabetic: No     Tobacco smoker: No     Systolic Blood Pressure: 103 mmHg     Is BP treated: No     HDL Cholesterol: 58 mg/dL     Total Cholesterol: 238 mg/dL     78/29/5621    3:08 PM 11/24/2021    1:02 PM 09/02/2021    8:30 AM  Depression screen PHQ 2/9  Decreased Interest 0 0 0  Down, Depressed, Hopeless 0 0 0  PHQ - 2 Score 0 0 0  Altered sleeping 0 0   Tired, decreased energy 1 1   Change in appetite 0 0   Feeling bad or failure about yourself  0 0   Trouble concentrating 0 0   Moving slowly or fidgety/restless 0 0   Suicidal thoughts 0 0   PHQ-9 Score 1 1   Difficult doing work/chores Not difficult at all Not difficult at all        12/07/2022    1:11 PM 11/24/2021    1:02 PM  GAD 7 : Generalized Anxiety Score  Nervous, Anxious, on Edge 1 0  Control/stop worrying 0 0  Worry too much - different things 1 0  Trouble relaxing 0 0  Restless 0 0  Easily annoyed or irritable 0 0  Afraid - awful might happen 0 0  Total GAD 7 Score 2 0  Anxiety Difficulty Not difficult at all Not difficult at all      11/24/2021    1:02 PM 11/24/2021    1:14 PM 12/07/2022    1:10 PM  Fall Risk  Falls in the past year? 0 0 0  Was there an injury with Fall? 0 0 0  Fall Risk Category Calculator 0 0 0  Fall Risk Category (Retired)  Low Low   (RETIRED) Patient Fall Risk Level Low fall risk Low fall risk   Patient at Risk for Falls Due to No Fall Risks No Fall Risks No Fall Risks  Fall risk Follow up Falls evaluation completed Falls prevention discussed Falls evaluation completed    Functional Status Survey: Is the patient deaf or have difficulty hearing?: No Does the patient have difficulty seeing, even when wearing glasses/contacts?: No Does the patient have difficulty concentrating, remembering, or making decisions?: No Does the patient have difficulty walking or climbing stairs?: No Does the patient have difficulty dressing or bathing?: No Does the patient have difficulty doing errands alone such as visiting a doctor's office or shopping?: No    Past Medical History:  Past Medical History:  Diagnosis Date   Childhood asthma    Dry eyes    GERD (gastroesophageal reflux  disease)     Surgical History:  Past Surgical History:  Procedure Laterality Date   COLONOSCOPY WITH PROPOFOL N/A 11/25/2016   Procedure: COLONOSCOPY WITH PROPOFOL;  Surgeon: Midge Minium, MD;  Location: [facility];  Service: Endoscopy;  Laterality: N/A;   SALPINGECTOMY  2010    Medications:  Current Outpatient Medications on File Prior to Visit  Medication Sig   cholecalciferol (VITAMIN D3) 25 MCG (1000 UNIT) tablet Take 1,000 Units by mouth daily.   OVER THE COUNTER MEDICATION Eye ointment; once daily at bedtime   XIIDRA 5 % SOLN INSTILL 1 DROP OU BID   No current facility-administered medications on file prior to visit.    Allergies:  Allergies  Allergen Reactions   Fish Allergy Anaphylaxis    Freshwater fish cause asthma symptoms. 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[place] [employer]    Comment: [place]  Tobacco Use   Smoking status: Never   Smokeless tobacco: Never  Vaping Use   Vaping status: Never Used  Substance and Sexual Activity   Alcohol use: No   Drug use: No   Sexual activity: Yes    Birth control/protection: None  Other Topics Concern   Not on file  Social History Narrative   Not on file   Social Determinants of Health   Financial Resource Strain: Low Risk  (12/07/2022)   Overall Financial Resource Strain (CARDIA)    Difficulty of Paying Living Expenses: Not hard at all  Food Insecurity: No Food Insecurity (12/07/2022)   Hunger Vital Sign    Worried About Running Out of Food in the Last Year: Never true    Ran Out of Food in the Last Year: Never true  Transportation Needs: No Transportation Needs (12/07/2022)   PRAPARE - Administrator, Civil Service (Medical): No    Lack of Transportation (Non-Medical): No  Physical Activity: Sufficiently Active (12/07/2022)   Exercise Vital Sign    Days of Exercise per Week: 6 days    Minutes of Exercise per Session: 30 min  Stress: No Stress Concern Present (12/07/2022)   [employer] of Occupational Health - Occupational Stress Questionnaire    Feeling of Stress : Only a little  Social Connections: Moderately Isolated (12/07/2022)   Social Connection and Isolation Panel [NHANES]    Frequency of Communication with Friends and Family: Never    Frequency of Social Gatherings with Friends and Family: Never    Attends Religious Services: More than 4 times per year    Active Member of [employer] or Organizations: No    Attends Banker Meetings: Never    Marital Status: Married  Catering manager Violence: Not At Risk (12/07/2022)   Humiliation, Afraid, Rape, and Kick questionnaire    Fear of Current or Ex-Partner: No    Emotionally Abused: No    Physically Abused: No     Sexually Abused: No   Social History   Tobacco Use  Smoking Status Never  Smokeless Tobacco Never   Social History   Substance and Sexual Activity  Alcohol  Use No    Family History:  Family History  Problem Relation Age of Onset   Glaucoma Mother    Alzheimer's disease Mother    Dementia Mother    Diabetes Father    Lung cancer Father    Brain cancer Father    Throat cancer Brother    Healthy Son    Retinal detachment Paternal Aunt    Diabetes Maternal Grandfather    Hyperlipidemia Maternal Grandfather    Diabetes Paternal Grandfather    Hyperlipidemia Paternal Grandfather    Breast cancer Cousin        mat cousin   Ovarian cancer Neg Hx    Colon cancer Neg Hx     Past medical history, surgical history, medications, allergies, family history and social history reviewed with patient today and changes made to appropriate areas of the chart.   ROS All other ROS negative except what is listed above and in the HPI.      Objective:    BP 103/64   Pulse 61   Temp 98.1 F (36.7 C) (Oral)   Ht 5' 2.9" (1.598 m)   Wt 152 lb 9.6 oz (69.2 kg)   LMP  (LMP Unknown)   SpO2 98%   BMI 27.12 kg/m   Wt Readings from Last 3 Encounters:  12/07/22 152 lb 9.6 oz (69.2 kg)  11/24/21 152 lb 6.4 oz (69.1 kg)  09/02/21 153 lb 9.6 oz (69.7 kg)    Physical Exam Constitutional:      General: She is awake. She is not in acute distress.    Appearance: She is well-developed. She is not ill-appearing.  HENT:     Head: Normocephalic and atraumatic.     Right Ear: Hearing, tympanic membrane, ear canal and external ear normal. No drainage.     Left Ear: Hearing, tympanic membrane, ear canal and external ear normal. No drainage.     Nose: Nose normal.     Right Sinus: No maxillary sinus tenderness or frontal sinus tenderness.     Left Sinus: No maxillary sinus tenderness or frontal sinus tenderness.     Mouth/Throat:     Mouth: Mucous membranes are moist.     Pharynx: Oropharynx  is clear. Uvula midline. No pharyngeal swelling, oropharyngeal exudate or posterior oropharyngeal erythema.  Eyes:     General: Lids are normal.        Right eye: No discharge.        Left eye: No discharge.     Extraocular Movements: Extraocular movements intact.     Conjunctiva/sclera: Conjunctivae normal.     Pupils: Pupils are equal, round, and reactive to light.     Visual Fields: Right eye visual fields normal and left eye visual fields normal.  Neck:     Thyroid: No thyromegaly.     Vascular: No carotid bruit.     Trachea: Trachea normal.  Cardiovascular:     Rate and Rhythm: Normal rate and regular rhythm.     Heart sounds: Normal heart sounds. No murmur heard.    No gallop.  Pulmonary:     Effort: Pulmonary effort is normal. No accessory muscle usage or respiratory distress.     Breath sounds: Normal breath sounds.  Abdominal:     General: Bowel sounds are normal.     Palpations: Abdomen is soft. There is no hepatomegaly or splenomegaly.     Tenderness: There is no abdominal tenderness.  Musculoskeletal:        General: Normal  range of motion.     Cervical back: Normal range of motion and neck supple.     Right lower leg: No edema.     Left lower leg: No edema.  Lymphadenopathy:     Head:     Right side of head: No submental, submandibular, tonsillar, preauricular or posterior auricular adenopathy.     Left side of head: No submental, submandibular, tonsillar, preauricular or posterior auricular adenopathy.     Cervical: No cervical adenopathy.  Skin:    General: Skin is warm and dry.     Capillary Refill: Capillary refill takes less than 2 seconds.     Findings: No rash.  Neurological:     Mental Status: She is alert and oriented to person, place, and time.     Gait: Gait is intact.     Deep Tendon Reflexes: Reflexes are normal and symmetric.     Reflex Scores:      Brachioradialis reflexes are 2+ on the right side and 2+ on the left side.      Patellar reflexes  are 2+ on the right side and 2+ on the left side. Psychiatric:        Attention and Perception: Attention normal.        Mood and Affect: Mood normal.        Speech: Speech normal.        Behavior: Behavior normal. Behavior is cooperative.        Thought Content: Thought content normal.        Judgment: Judgment normal.     Results for orders placed or performed in visit on 11/24/21  CBC with Differential/Platelet  Result Value Ref Range   WBC 10.4 3.4 - 10.8 x10E3/uL   RBC 4.41 3.77 - 5.28 x10E6/uL   Hemoglobin 13.3 11.1 - 15.9 g/dL   Hematocrit 40.9 81.1 - 46.6 %   MCV 91 79 - 97 fL   MCH 30.2 26.6 - 33.0 pg   MCHC 33.0 31.5 - 35.7 g/dL   RDW 91.4 78.2 - 95.6 %   Platelets 253 150 - 450 x10E3/uL   Neutrophils 69 Not Estab. %   Lymphs 23 Not Estab. %   Monocytes 6 Not Estab. %   Eos 1 Not Estab. %   Basos 1 Not Estab. %   Neutrophils Absolute 7.3 (H) 1.4 - 7.0 x10E3/uL   Lymphocytes Absolute 2.3 0.7 - 3.1 x10E3/uL   Monocytes Absolute 0.6 0.1 - 0.9 x10E3/uL   EOS (ABSOLUTE) 0.1 0.0 - 0.4 x10E3/uL   Basophils Absolute 0.1 0.0 - 0.2 x10E3/uL   Immature Granulocytes 0 Not Estab. %   Immature Grans (Abs) 0.0 0.0 - 0.1 x10E3/uL  Comprehensive metabolic panel  Result Value Ref Range   Glucose 91 70 - 99 mg/dL   BUN 9 6 - 24 mg/dL   Creatinine, Ser 2.13 0.57 - 1.00 mg/dL   eGFR 88 >08 MV/HQI/6.96   BUN/Creatinine Ratio 11 9 - 23   Sodium 139 134 - 144 mmol/L   Potassium 4.0 3.5 - 5.2 mmol/L   Chloride 105 96 - 106 mmol/L   CO2 22 20 - 29 mmol/L   Calcium 9.0 8.7 - 10.2 mg/dL   Total Protein 7.0 6.0 - 8.5 g/dL   Albumin 4.5 3.8 - 4.9 g/dL   Globulin, Total 2.5 1.5 - 4.5 g/dL   Albumin/Globulin Ratio 1.8 1.2 - 2.2   Bilirubin Total <0.2 0.0 - 1.2 mg/dL   Alkaline Phosphatase 62 44 - 121  IU/L   AST 17 0 - 40 IU/L   ALT 14 0 - 32 IU/L  TSH  Result Value Ref Range   TSH 2.430 0.450 - 4.500 uIU/mL  VITAMIN D 25 Hydroxy (Vit-D Deficiency, Fractures)  Result Value Ref  Range   Vit D, 25-Hydroxy 18.9 (L) 30.0 - 100.0 ng/mL  Lipid Panel w/o Chol/HDL Ratio  Result Value Ref Range   Cholesterol, Total 238 (H) 100 - 199 mg/dL   Triglycerides 606 0 - 149 mg/dL   HDL 58 >30 mg/dL   VLDL Cholesterol Cal 22 5 - 40 mg/dL   LDL Chol Calc (NIH) 160 (H) 0 - 99 mg/dL  HgB F0X  Result Value Ref Range   Hgb A1c MFr Bld 5.3 4.8 - 5.6 %   Est. average glucose Bld gHb Est-mCnc 105 mg/dL  Hepatitis C antibody  Result Value Ref Range   Hep C Virus Ab Non Reactive Non Reactive  HIV Antibody (routine testing w rflx)  Result Value Ref Range   HIV Screen 4th Generation wRfx Preliminary Reactive Non Reactive  HIV 1/2 Ab Differentiation  Result Value Ref Range   HIV 1 Ab Non Reactive Non Reactive   HIV 2 Ab Non Reactive Non Reactive   NOTE (HIV CONF MULTIP Negative   HIV-1/HIV-2 Qualitative RNA  Result Value Ref Range   HIV-1 RNA, Qualitative Non Reactive Non Reactive   HIV-2 RNA, Qualitative Non Reactive Non Reactive   Final Interpretation HIV Negative       Assessment & Plan:   Problem List Items Addressed This Visit       Other   Elevated low density lipoprotein (LDL) cholesterol level - Primary (Chronic)    Noted on past labs, discussed with patient and educated.  ASCVD 1.8% -- continue diet and exercise focus, low risk.  Labs today.      Relevant Orders   Comprehensive metabolic panel   Lipid Panel w/o Chol/HDL Ratio   Refused influenza vaccine    Refuses flu vaccine, educated on this.       Other Visit Diagnoses     Vitamin D deficiency       Recheck Vitamin D today and continue supplement.   Relevant Orders   VITAMIN D 25 Hydroxy (Vit-D Deficiency, Fractures)   Encounter for annual physical exam       Annual physical today with labs and health maintenance reviewed, discussed with patient.   Relevant Orders   CBC with Differential/Platelet   TSH        Follow up plan: Return in about 1 year (around 12/07/2023) for Annual  Physical.   LABORATORY TESTING:  - Pap smear: up to date  IMMUNIZATIONS:   - Tdap: Tetanus vaccination status reviewed: last tetanus booster within 10 years. - Influenza: Refused - Pneumovax: Not applicable - Prevnar: Not applicable - COVID: Refused - HPV: Not applicable - Shingrix vaccine: Refused  SCREENING: -Mammogram: Up to date  - Colonoscopy: Up to date  - Bone Density: Not applicable  -Hearing Test: Not applicable  -Spirometry: Not applicable   PATIENT COUNSELING:   Advised to take 1 mg of folate supplement per day if capable of pregnancy.   Sexuality: Discussed sexually transmitted diseases, partner selection, use of condoms, avoidance of unintended pregnancy  and contraceptive alternatives.   Advised to avoid cigarette smoking.  I discussed with the patient that most people either abstain from alcohol or drink within safe limits (<=14/week and <=4 drinks/occasion for males, <=7/weeks and <= 3  drinks/occasion for females) and that the risk for alcohol disorders and other health effects rises proportionally with the number of drinks per week and how often a drinker exceeds daily limits.  Discussed cessation/primary prevention of drug use and availability of treatment for abuse.   Diet: Encouraged to adjust caloric intake to maintain  or achieve ideal body weight, to reduce intake of dietary saturated fat and total fat, to limit sodium intake by avoiding high sodium foods and not adding table salt, and to maintain adequate dietary potassium and calcium preferably from fresh fruits, vegetables, and low-fat dairy products.    Stressed the importance of regular exercise  Injury prevention: Discussed safety belts, safety helmets, smoke detector, smoking near bedding or upholstery.   Dental health: Discussed importance of regular tooth brushing, flossing, and dental visits.    NEXT PREVENTATIVE PHYSICAL DUE IN 1 YEAR. Return in about 1 year (around 12/07/2023) for Annual  Physical.

## 2022-12-08 LAB — CBC WITH DIFFERENTIAL/PLATELET
Basophils Absolute: 0.1 10*3/uL (ref 0.0–0.2)
Basos: 1 %
EOS (ABSOLUTE): 0.1 10*3/uL (ref 0.0–0.4)
Eos: 1 %
Hematocrit: 42.6 % (ref 34.0–46.6)
Hemoglobin: 13.7 g/dL (ref 11.1–15.9)
Immature Grans (Abs): 0 10*3/uL (ref 0.0–0.1)
Immature Granulocytes: 0 %
Lymphocytes Absolute: 2.6 10*3/uL (ref 0.7–3.1)
Lymphs: 34 %
MCH: 29.7 pg (ref 26.6–33.0)
MCHC: 32.2 g/dL (ref 31.5–35.7)
MCV: 92 fL (ref 79–97)
Monocytes Absolute: 0.5 10*3/uL (ref 0.1–0.9)
Monocytes: 7 %
Neutrophils Absolute: 4.5 10*3/uL (ref 1.4–7.0)
Neutrophils: 57 %
Platelets: 271 10*3/uL (ref 150–450)
RBC: 4.62 x10E6/uL (ref 3.77–5.28)
RDW: 12.3 % (ref 11.7–15.4)
WBC: 7.7 10*3/uL (ref 3.4–10.8)

## 2022-12-08 LAB — COMPREHENSIVE METABOLIC PANEL
ALT: 23 [IU]/L (ref 0–32)
AST: 22 [IU]/L (ref 0–40)
Albumin: 4.4 g/dL (ref 3.8–4.9)
Alkaline Phosphatase: 79 [IU]/L (ref 44–121)
BUN/Creatinine Ratio: 17 (ref 9–23)
BUN: 14 mg/dL (ref 6–24)
Bilirubin Total: 0.2 mg/dL (ref 0.0–1.2)
CO2: 24 mmol/L (ref 20–29)
Calcium: 9.6 mg/dL (ref 8.7–10.2)
Chloride: 102 mmol/L (ref 96–106)
Creatinine, Ser: 0.82 mg/dL (ref 0.57–1.00)
Globulin, Total: 2.8 g/dL (ref 1.5–4.5)
Glucose: 83 mg/dL (ref 70–99)
Potassium: 4.1 mmol/L (ref 3.5–5.2)
Sodium: 141 mmol/L (ref 134–144)
Total Protein: 7.2 g/dL (ref 6.0–8.5)
eGFR: 83 mL/min/{1.73_m2} (ref >59)

## 2022-12-08 LAB — VITAMIN D 25 HYDROXY (VIT D DEFICIENCY, FRACTURES): Vit D, 25-Hydroxy: 44.9 ng/mL (ref 30.0–100.0)

## 2022-12-08 LAB — LIPID PANEL W/O CHOL/HDL RATIO
Cholesterol, Total: 234 mg/dL — ABNORMAL HIGH (ref 100–199)
HDL: 56 mg/dL (ref >39)
LDL Chol Calc (NIH): 152 mg/dL — ABNORMAL HIGH (ref 0–99)
Triglycerides: 145 mg/dL (ref 0–149)
VLDL Cholesterol Cal: 26 mg/dL (ref 5–40)

## 2022-12-08 LAB — TSH: TSH: 2.4 u[IU]/mL (ref 0.450–4.500)

## 2022-12-10 NOTE — Progress Notes (Signed)
Good morning, please let Kelly Fleming know her labs have returned.  These are overall normal with exception of ongoing elevation in total cholesterol and LDL (bad cholesterol), but no medications needed.  Continue heavy focus on health diet changes and regular exercise.  Any questions? Keep being amazing!!  Thank you for allowing me to participate in your care.  I appreciate you. Kindest regards, Amaad Byers

## 2022-12-12 ENCOUNTER — Telehealth: Payer: Self-pay

## 2022-12-12 NOTE — Telephone Encounter (Signed)
-----   Message from Marjie Skiff sent at 12/10/2022  8:37 AM EST ----- Good morning, please let Shaketa know her labs have returned.  These are overall normal with exception of ongoing elevation in total cholesterol and LDL (bad cholesterol), but no medications needed.  Continue heavy focus on health diet changes and regular exercise.  Any questions? Keep being amazing!!  Thank you for allowing me to participate in your care.  I appreciate you. Kindest regards, Jolene

## 2022-12-13 ENCOUNTER — Ambulatory Visit: Payer: Self-pay | Admitting: *Deleted

## 2022-12-13 NOTE — Telephone Encounter (Signed)
Reason for Disposition  [1] Follow-up call to recent contact AND [2] information only call, no triage required  Answer Assessment - Initial Assessment Questions 1. REASON FOR CALL or QUESTION: "What is your reason for calling today?" or "How can I best help you?" or "What question do you have that I can help answer?"     Pt given lab results per notes of Aura Dials, NP on 12/10/2022 at 8:37 AM.   Pt verbalized understanding.  Protocols used: Information Only Call - No Triage-A-AH

## 2022-12-13 NOTE — Telephone Encounter (Signed)
  Chief Complaint: Called in and was given her lab result message from [facility]. Symptoms: N/A Frequency: N/A Pertinent Negatives: Patient denies N/A Disposition: [] ED /[] Urgent Care (no appt availability in office) / [] Appointment(In office/virtual)/ []  [place] [facility]/ [x] Home Care/ [] Refused Recommended Disposition /[] [facility]/ []  Follow-up with PCP Additional Notes: No questions

## 2022-12-26 ENCOUNTER — Telehealth: Payer: Self-pay | Admitting: Nurse Practitioner

## 2022-12-26 NOTE — Telephone Encounter (Signed)
Called and advised patient of what the provider stated about her Mother, we was going to schedule her Mother on Jan 8th.  However patient had to get off the phone, she stated that she would call the office back.  Will try to call back again to get her Mother scheduled.

## 2022-12-26 NOTE — Telephone Encounter (Signed)
Called patient back to get her Mother scheduled for a New Patient appointment per provider.

## 2022-12-26 NOTE — Telephone Encounter (Unsigned)
Copied from CRM [id number]. Topic: Appointment Scheduling - Scheduling Inquiry for Clinic >> Dec 26, 2022  8:12 AM Payton Doughty wrote: Reason for CRM: pt wants to know if Corrie Dandy will see her mom asa new pt?  She has been having stomach pains and not eating.

## 2023-01-06 ENCOUNTER — Ambulatory Visit: Payer: Self-pay

## 2023-01-06 NOTE — Telephone Encounter (Signed)
  Chief Complaint: UTI Symptoms: pelvic pain, dysuria, and cloudiness Frequency: yesterday Pertinent Negatives: NA Disposition: [] ED /[x] Urgent Care (no appt availability in office) / [] Appointment(In office/virtual)/ []  [facility]/ [] Home Care/ [] Refused Recommended Disposition /[] [facility]/ []  Follow-up with PCP Additional Notes: no appts available until 01/12/23. Pt doesn't have access to Mychart so recommended going to UC and be seen. Informed pt of UC hours for weekend. Care advice given and pt verbalized understanding. Will monitor sx and try AZO and if no improvement will call back next week for appt if needed.   Summary: possible UTI.   Pt stated has a possible UTI. Pain in the pelvic area has pain when urinating; urine is cloudy. Currently pain is 3 out of 10. No appointments.  Seeking clinical advice.         Reason for Disposition  Bad or foul-smelling urine  Answer Assessment - Initial Assessment Questions 1. SYMPTOM: "What's the main symptom you're concerned about?" (e.g., frequency, incontinence)     Pain  2. ONSET: "When did the  sx  start?"     yesterday 3. PAIN: "Is there any pain?" If Yes, ask: "How bad is it?" (Scale: 1-10; mild, moderate, severe)     Mild to moderate 4. CAUSE: "What do you think is causing the symptoms?"     UTI 5. OTHER SYMPTOMS: "Do you have any other symptoms?" (e.g., blood in urine, fever, flank pain, pain with urination)     Pelvic pain, dysuria, and cloudiness  Protocols used: Urinary Symptoms-A-AH

## 2023-02-01 ENCOUNTER — Encounter: Payer: Self-pay | Admitting: Nurse Practitioner

## 2023-02-01 ENCOUNTER — Ambulatory Visit: Payer: 59 | Admitting: Nurse Practitioner

## 2023-02-01 VITALS — BP 102/65 | HR 78 | Temp 97.6°F | Wt 152.2 lb

## 2023-02-01 DIAGNOSIS — R399 Unspecified symptoms and signs involving the genitourinary system: Secondary | ICD-10-CM | POA: Diagnosis not present

## 2023-02-01 DIAGNOSIS — R8281 Pyuria: Secondary | ICD-10-CM | POA: Diagnosis not present

## 2023-02-01 NOTE — Progress Notes (Signed)
BP 102/65   Pulse 78   Temp 97.6 F (36.4 C) (Oral)   Wt 152 lb 3.2 oz (69 kg)   LMP  (LMP Unknown)   SpO2 97%   BMI 27.05 kg/m    Subjective:    Patient ID: Kelly Fleming, female    DOB: [birth year], 58 y.o.   MRN: [id number]  HPI: Kelly Fleming is a 58 y.o. female  Chief Complaint  Patient presents with   Urinary Tract Infection    Patient states she has been noticing some lower abdominal pain and pain with urination. States she has noticed and experienced these symptoms for the last year.    URINARY SYMPTOMS Symptoms started a year ago on and off.  Notices early in morning, but not during the day.  Is sexually active, no discomfort -- she does feel like inside is dry.   Dysuria:  discomfort Urinary frequency: no Urgency: no Small volume voids: no Symptom severity: no Urinary incontinence: no Foul odor:  occasional Hematuria: no Abdominal pain: yes Back pain: no Suprapubic pain/pressure: yes Flank pain: no Fever:  no Vomiting: no Status: stable -- staying about the same Previous urinary tract infection: yes Recurrent urinary tract infection: no Sexual activity: monogamous History of sexually transmitted disease: no Treatments attempted: cranberry tablets, no difference     Mother with dementia just placed on hospice.    02/01/2023    4:36 PM 12/07/2022    1:11 PM 11/24/2021    1:02 PM 09/02/2021    8:30 AM  Depression screen PHQ 2/9  Decreased Interest 0 0 0 0  Down, Depressed, Hopeless 1 0 0 0  PHQ - 2 Score 1 0 0 0  Altered sleeping 0 0 0   Tired, decreased energy 1 1 1    Change in appetite 0 0 0   Feeling bad or failure about yourself  0 0 0   Trouble concentrating 0 0 0   Moving slowly or fidgety/restless 0 0 0   Suicidal thoughts 0 0 0   PHQ-9 Score 2 1 1    Difficult doing work/chores  Not difficult at all Not difficult at all        02/01/2023    4:36 PM 12/07/2022    1:11 PM 11/24/2021    1:02 PM  GAD 7 : Generalized Anxiety  Score  Nervous, Anxious, on Edge 1 1 0  Control/stop worrying 1 0 0  Worry too much - different things 1 1 0  Trouble relaxing 1 0 0  Restless 0 0 0  Easily annoyed or irritable 1 0 0  Afraid - awful might happen 1 0 0  Total GAD 7 Score 6 2 0  Anxiety Difficulty Somewhat difficult Not difficult at all Not difficult at all      Relevant past medical, surgical, family and social history reviewed and updated as indicated. Interim medical history since our last visit reviewed. Allergies and medications reviewed and updated.  Review of Systems  Constitutional:  Negative for activity change, appetite change, diaphoresis, fatigue and fever.  Respiratory:  Negative for cough, chest tightness and shortness of breath.   Cardiovascular:  Negative for chest pain, palpitations and leg swelling.  Gastrointestinal:  Positive for abdominal pain. Negative for abdominal distention, constipation, diarrhea, nausea and vomiting.  Genitourinary:  Positive for dysuria. Negative for decreased urine volume, frequency, hematuria, urgency, vaginal discharge and vaginal pain.  Neurological: Negative.   Psychiatric/Behavioral: Negative.      Per HPI unless specifically indicated  above     Objective:    BP 102/65   Pulse 78   Temp 97.6 F (36.4 C) (Oral)   Wt 152 lb 3.2 oz (69 kg)   LMP  (LMP Unknown)   SpO2 97%   BMI 27.05 kg/m   Wt Readings from Last 3 Encounters:  02/01/23 152 lb 3.2 oz (69 kg)  12/07/22 152 lb 9.6 oz (69.2 kg)  11/24/21 152 lb 6.4 oz (69.1 kg)    Physical Exam Vitals and nursing note reviewed.  Constitutional:      General: She is awake. She is not in acute distress.    Appearance: She is well-developed and well-groomed. She is not ill-appearing or toxic-appearing.  HENT:     Head: Normocephalic.     Right Ear: Hearing and external ear normal.     Left Ear: Hearing and external ear normal.  Eyes:     General: Lids are normal.        Right eye: No discharge.         Left eye: No discharge.     Conjunctiva/sclera: Conjunctivae normal.     Pupils: Pupils are equal, round, and reactive to light.  Neck:     Thyroid: No thyromegaly.     Vascular: No carotid bruit.  Cardiovascular:     Rate and Rhythm: Normal rate and regular rhythm.     Heart sounds: Normal heart sounds. No murmur heard.    No gallop.  Pulmonary:     Effort: Pulmonary effort is normal. No accessory muscle usage or respiratory distress.     Breath sounds: Normal breath sounds.  Abdominal:     General: Bowel sounds are normal. There is no distension.     Palpations: Abdomen is soft.     Tenderness: There is generalized abdominal tenderness. There is no right CVA tenderness or left CVA tenderness.  Musculoskeletal:     Cervical back: Normal range of motion and neck supple.     Right lower leg: No edema.     Left lower leg: No edema.  Lymphadenopathy:     Cervical: No cervical adenopathy.  Skin:    General: Skin is warm and dry.  Neurological:     Mental Status: She is alert and oriented to person, place, and time.     Deep Tendon Reflexes: Reflexes are normal and symmetric.     Reflex Scores:      Brachioradialis reflexes are 2+ on the right side and 2+ on the left side.      Patellar reflexes are 2+ on the right side and 2+ on the left side. Psychiatric:        Attention and Perception: Attention normal.        Mood and Affect: Mood normal.        Speech: Speech normal.        Behavior: Behavior normal. Behavior is cooperative.        Thought Content: Thought content normal.    Results for orders placed or performed in visit on 12/07/22  CBC with Differential/Platelet   Collection Time: 12/07/22  1:31 PM  Result Value Ref Range   WBC 7.7 3.4 - 10.8 x10E3/uL   RBC 4.62 3.77 - 5.28 x10E6/uL   Hemoglobin 13.7 11.1 - 15.9 g/dL   Hematocrit 57.3 22.0 - 46.6 %   MCV 92 79 - 97 fL   MCH 29.7 26.6 - 33.0 pg   MCHC 32.2 31.5 - 35.7 g/dL   RDW 12.3  11.7 - 15.4 %   Platelets 271  150 - 450 x10E3/uL   Neutrophils 57 Not Estab. %   Lymphs 34 Not Estab. %   Monocytes 7 Not Estab. %   Eos 1 Not Estab. %   Basos 1 Not Estab. %   Neutrophils Absolute 4.5 1.4 - 7.0 x10E3/uL   Lymphocytes Absolute 2.6 0.7 - 3.1 x10E3/uL   Monocytes Absolute 0.5 0.1 - 0.9 x10E3/uL   EOS (ABSOLUTE) 0.1 0.0 - 0.4 x10E3/uL   Basophils Absolute 0.1 0.0 - 0.2 x10E3/uL   Immature Granulocytes 0 Not Estab. %   Immature Grans (Abs) 0.0 0.0 - 0.1 x10E3/uL  Comprehensive metabolic panel   Collection Time: 12/07/22  1:31 PM  Result Value Ref Range   Glucose 83 70 - 99 mg/dL   BUN 14 6 - 24 mg/dL   Creatinine, Ser 1.61 0.57 - 1.00 mg/dL   eGFR 83 >09 UE/AVW/0.98   BUN/Creatinine Ratio 17 9 - 23   Sodium 141 134 - 144 mmol/L   Potassium 4.1 3.5 - 5.2 mmol/L   Chloride 102 96 - 106 mmol/L   CO2 24 20 - 29 mmol/L   Calcium 9.6 8.7 - 10.2 mg/dL   Total Protein 7.2 6.0 - 8.5 g/dL   Albumin 4.4 3.8 - 4.9 g/dL   Globulin, Total 2.8 1.5 - 4.5 g/dL   Bilirubin Total 0.2 0.0 - 1.2 mg/dL   Alkaline Phosphatase 79 44 - 121 IU/L   AST 22 0 - 40 IU/L   ALT 23 0 - 32 IU/L  Lipid Panel w/o Chol/HDL Ratio   Collection Time: 12/07/22  1:31 PM  Result Value Ref Range   Cholesterol, Total 234 (H) 100 - 199 mg/dL   Triglycerides 119 0 - 149 mg/dL   HDL 56 >14 mg/dL   VLDL Cholesterol Cal 26 5 - 40 mg/dL   LDL Chol Calc (NIH) 782 (H) 0 - 99 mg/dL  TSH   Collection Time: 12/07/22  1:31 PM  Result Value Ref Range   TSH 2.400 0.450 - 4.500 uIU/mL  VITAMIN D 25 Hydroxy (Vit-D Deficiency, Fractures)   Collection Time: 12/07/22  1:31 PM  Result Value Ref Range   Vit D, 25-Hydroxy 44.9 30.0 - 100.0 ng/mL      Assessment & Plan:   Problem List Items Addressed This Visit       Other   Urinary symptom or sign - Primary   Acute with on and off symptoms for over a year. UA noting trace Leuk, moderate bacteria. Will send for culture, but also obtain wet prep as suspect this may be bacteria vaginosis based  on symptoms.  Suspect some vaginal atrophy present, discussed with her if any recurrent infections or discomfort then would recommend vaginal estrogen cream.  Determine next steps after wet prep returns.      Relevant Orders   Urinalysis, Routine w reflex microscopic   WET PREP FOR TRICH, YEAST, CLUE   Other Visit Diagnoses       Pyuria       urine for culture   Relevant Orders   Urine Culture        Follow up plan: Return if symptoms worsen or fail to improve.

## 2023-02-01 NOTE — Assessment & Plan Note (Signed)
Acute with on and off symptoms for over a year. UA noting trace Leuk, moderate bacteria. Will send for culture, but also obtain wet prep as suspect this may be bacteria vaginosis based on symptoms.  Suspect some vaginal atrophy present, discussed with her if any recurrent infections or discomfort then would recommend vaginal estrogen cream.  Determine next steps after wet prep returns.

## 2023-02-01 NOTE — Patient Instructions (Addendum)
Vaginal Infection (Bacterial Vaginosis): What to Know  Bacterial vaginosis is an infection of the vagina. It happens when the balance of normal germs (bacteria) in the vagina changes. If you don't get treated, it can make it easier for you to get other infections from sex. These are called sexually transmitted infections (STIs). If you're pregnant, you need to get treated right away. This infection can cause a baby to be born early or at a low birth weight. What are the causes? This infection happens when too many harmful germs grow in the vagina. You can't get this infection from toilet seats, bedsheets, swimming pools, or things that touch your vagina. What increases the risk? Having sex with a new person or more than one person. Having sex without protection. Douching. Having an intrauterine device (IUD). Smoking. Using drugs or drinking alcohol. These can lead you to do risky things. Taking certain antibiotics. Being pregnant. What are the signs or symptoms? Some females have no symptoms. Symptoms may include: A gray or white discharge from your vagina. It can be watery or foamy. A fishy smell. This can happen after sex or during your menstrual period. Itching in and around your vagina. Burning or pain when you pee. How is this treated? This infection is treated with antibiotics. These may be given to you as: A pill. A cream for your vagina. A medicine that you put into your vagina (suppository). If the infection comes back, you may need more antibiotics. Follow these instructions at home: Medicines Take your medicines as told. Take or use your antibiotics as told. Do not stop using them even if you start to feel better. General instructions If the person you have sex with is a female, tell her that you have this infection. She will need to follow up with her doctor. Female partners don't need to be treated. Do not have sex until you finish treatment. Drink more fluids as  told. Keep your vagina and butt clean. Wash these areas with warm water each day. Wipe from front to back after you poop. If you're breastfeeding a baby, talk to your doctor if you should keep doing so during treatment. How is this prevented? Self-care Do not douche. Do not use deodorant sprays on your vagina. Wear cotton underwear. Do not wear tight pants and pantyhose, especially in the summer. Safe sex Use condoms the correct way and every time you have sex. Use dental dams to protect yourself during oral sex. Limit how many people you have sex with. Get tested for STIs. The person you have sex with should also get tested. Drugs and alcohol Do not smoke, vape, or use nicotine or tobacco. Do not use drugs. Limit the amount of alcohol you drink because it can lead you to do risky things. Where to find more information To learn more: Go to TonerPromos.no. Click Health Topics A-Z. Type "bacterial vaginosis" in the search bar. American Sexual Health Association (ASHA): ashasexualhealth.org U.S. Department of Health and [employer], Office on Women's Health: TravelLesson.ca Contact a doctor if: Your symptoms don't get better, even after treatment. You have more discharge or pain when you pee. You have a fever or chills. You have pain in your belly or in the area between your hips. You have pain during sex. You bleed from your vagina between menstrual periods. This information is not intended to replace advice given to you by your health care provider. Make sure you discuss any questions you have with your health care provider. Document  Revised: 06/15/2022 Document Reviewed: 06/15/2022 Elsevier Patient Education  2024 [employer].

## 2023-02-02 ENCOUNTER — Other Ambulatory Visit: Payer: Self-pay | Admitting: Nurse Practitioner

## 2023-02-02 ENCOUNTER — Encounter: Payer: Self-pay | Admitting: Nurse Practitioner

## 2023-02-02 LAB — URINALYSIS, ROUTINE W REFLEX MICROSCOPIC
Bilirubin, UA: NEGATIVE
Glucose, UA: NEGATIVE
Ketones, UA: NEGATIVE
Nitrite, UA: NEGATIVE
Protein,UA: NEGATIVE
RBC, UA: NEGATIVE
Specific Gravity, UA: 1.01 (ref 1.005–1.030)
Urobilinogen, Ur: 0.2 mg/dL (ref 0.2–1.0)
pH, UA: 6 (ref 5.0–7.5)

## 2023-02-02 LAB — WET PREP FOR TRICH, YEAST, CLUE
Clue Cell Exam: POSITIVE — AB
Trichomonas Exam: NEGATIVE
Yeast Exam: NEGATIVE

## 2023-02-02 LAB — MICROSCOPIC EXAMINATION: RBC, Urine: NONE SEEN /[HPF] (ref 0–2)

## 2023-02-02 MED ORDER — METRONIDAZOLE 500 MG PO TABS: 500.0000 mg | ORAL_TABLET | Freq: Two times a day (BID) | ORAL | 0 refills | Status: AC

## 2023-02-02 NOTE — Progress Notes (Signed)
Contacted via MyChart   Good morning Kelly Fleming, your vaginal swab did come back showing clue cells as I suspected may be present -- meaning bacterial vaginosis.  Lets try a round of Flagyl by mouth twice a day for 7 days and if symptoms continue let me know.  Take this with food and even a probiotic yogurt to keep belly happy.:) Any questions?

## 2023-02-03 LAB — URINE CULTURE

## 2023-02-04 MED ORDER — CIPROFLOXACIN HCL 500 MG PO TABS: 500.0000 mg | ORAL_TABLET | Freq: Every day | ORAL | 0 refills | Status: AC

## 2023-02-04 NOTE — Progress Notes (Signed)
Contacted via MyChart   Good morning Kelly Fleming, your urine culture has returned and is a little tricky.  Often we do not treat until culture growth is 100,000 or greater.  Your level is 50,000 to 100,000.  I feel more comfortable treating this as I am concerned you have something starting or maybe something that is slowly going away.  I will send in 3 days of Ciprofloxacin, since you are allergic to other treatments.  Continue Flagyl as well.  Any questions? Keep being stellar!!  Thank you for allowing me to participate in your care.  I appreciate you. Kindest regards, Facundo Allemand

## 2023-08-22 ENCOUNTER — Ambulatory Visit: Payer: Self-pay

## 2023-08-22 NOTE — Telephone Encounter (Signed)
 FYI Only or Action Required?: Action required by provider: request for appointment.  Patient was last seen in primary care on 02/01/2023 by Valerio Melanie DASEN, NP.  Called Nurse Triage reporting Leg Injury.  Symptoms began several weeks ago.  Interventions attempted: Nothing.  Symptoms are: unchanged.Pt. Had a bruise to shin and a lump. Bruise is gone, lump remains. Tender to touch. Asking to be worked in Friday.  Triage Disposition: See PCP When Office is Open (Within 3 Days)  Patient/caregiver understands and will follow disposition?: Yes   Copied from CRM #[id number]. Topic: Clinical - Red Word Triage >> Aug 22, 2023  2:01 PM Rachelle R wrote: Red Word that prompted transfer to Nurse Triage: Patient has a Painful knot/bump on the front of her left to the right of the shin. Started out as a bruise and grew. Bruise is gone and has stopped growing but it is not going away, size of a half dollar. Reason for Disposition  [1] After 3 days AND [2] still limping  Answer Assessment - Initial Assessment Questions 1. MECHANISM: How did the injury happen? (e.g., twisting injury, direct blow)      unsure 2. ONSET: When did the injury happen? (e.g., minutes, hours ago)      2 weeks 3. LOCATION: Where is the injury located?      Left shin 4. APPEARANCE of INJURY: What does the injury look like?  (e.g., deformity of leg)     Lump - size of half dollar 5. SEVERITY: Can you put weight on that leg? Can you walk?      yes 6. SIZE: For cuts, bruises, or swelling, ask: How large is it? (e.g., inches or centimeters)      N/a 7. PAIN: Is there pain? If Yes, ask: How bad is the pain?   What does it keep you from doing? (Scale 0-10; or none, mild, moderate, severe)     moderate 8. TETANUS: For any breaks in the skin, ask: When was your last tetanus booster?     N/a 9. OTHER SYMPTOMS: Do you have any other symptoms?      N/a 10. PREGNANCY: Is there any chance you are pregnant?  When was your last menstrual period?       no  Protocols used: Leg Injury-A-AH

## 2023-08-23 NOTE — Telephone Encounter (Signed)
 Please reach out to patient and see if she can come Tuesday at 4:20 per Jolene.

## 2023-08-25 ENCOUNTER — Other Ambulatory Visit: Payer: Self-pay | Admitting: Nurse Practitioner

## 2023-08-25 DIAGNOSIS — Z1231 Encounter for screening mammogram for malignant neoplasm of breast: Secondary | ICD-10-CM

## 2023-08-26 NOTE — Patient Instructions (Signed)

## 2023-08-29 ENCOUNTER — Ambulatory Visit: Payer: Self-pay | Admitting: Nurse Practitioner

## 2023-08-29 ENCOUNTER — Encounter: Payer: Self-pay | Admitting: Nurse Practitioner

## 2023-08-29 VITALS — BP 125/70 | HR 68 | Temp 98.9°F | Ht 62.8 in | Wt 156.4 lb

## 2023-08-29 DIAGNOSIS — M7989 Other specified soft tissue disorders: Secondary | ICD-10-CM | POA: Insufficient documentation

## 2023-08-29 NOTE — Progress Notes (Signed)
 BP 125/70   Pulse 68   Temp 98.9 F (37.2 C) (Oral)   Ht 5' 2.8 (1.595 m)   Wt 156 lb 6.4 oz (70.9 kg)   LMP  (LMP Unknown)   SpO2 98%   BMI 27.88 kg/m    Subjective:    Patient ID: Kelly Fleming, female    DOB: 1965-09-21, 58 y.o.   MRN: [id number]  HPI: Kelly Fleming is a 58 y.o. female  Chief Complaint  Patient presents with   Mass    Patient states she has noticed a lump on the medial side of her left shin for the last 3 weeks. States the area is painful to the touch. States she also has a small lump or raised area on the crown of her head that has been there for a while. Does not hurt per patient, only itches occasionally.    SKIN MASSES Presents today for assessment of two skin masses. One on left shin area has been present for 3 weeks and then one to scalp area, has been present for a long time. Pain present to one on leg if presses on it. Duration: weeks Location: as above History of trauma in area: no Pain: yes Quality: yes Severity: mild  Redness: no Swelling: no Oozing: no Pus: no Fevers: no Nausea/vomiting: no Status: stable Treatments attempted:none   Relevant past medical, surgical, family and social history reviewed and updated as indicated. Interim medical history since our last visit reviewed. Allergies and medications reviewed and updated.  Review of Systems  Constitutional:  Negative for activity change, appetite change, diaphoresis, fatigue and fever.  Respiratory:  Negative for cough, chest tightness, shortness of breath and wheezing.   Cardiovascular:  Negative for chest pain, palpitations and leg swelling.  Gastrointestinal: Negative.   Neurological: Negative.   Psychiatric/Behavioral: Negative.      Per HPI unless specifically indicated above     Objective:    BP 125/70   Pulse 68   Temp 98.9 F (37.2 C) (Oral)   Ht 5' 2.8 (1.595 m)   Wt 156 lb 6.4 oz (70.9 kg)   LMP  (LMP Unknown)   SpO2 98%   BMI 27.88 kg/m   Wt  Readings from Last 3 Encounters:  08/29/23 156 lb 6.4 oz (70.9 kg)  02/01/23 152 lb 3.2 oz (69 kg)  12/07/22 152 lb 9.6 oz (69.2 kg)    Physical Exam Vitals and nursing note reviewed.  Constitutional:      General: She is awake. She is not in acute distress.    Appearance: She is well-developed and well-groomed. She is not ill-appearing or toxic-appearing.  HENT:     Head: Normocephalic.      Right Ear: Hearing and external ear normal.     Left Ear: Hearing and external ear normal.  Eyes:     General: Lids are normal.        Right eye: No discharge.        Left eye: No discharge.     Conjunctiva/sclera: Conjunctivae normal.     Pupils: Pupils are equal, round, and reactive to light.  Neck:     Thyroid: No thyromegaly.     Vascular: No carotid bruit.  Cardiovascular:     Rate and Rhythm: Normal rate and regular rhythm.     Heart sounds: Normal heart sounds. No murmur heard.    No gallop.  Pulmonary:     Effort: Pulmonary effort is normal. No accessory muscle usage or respiratory  distress.     Breath sounds: Normal breath sounds.  Abdominal:     General: Bowel sounds are normal. There is no distension.     Palpations: Abdomen is soft.     Tenderness: There is no abdominal tenderness.  Musculoskeletal:     Cervical back: Normal range of motion and neck supple.     Right lower leg: No edema.     Left lower leg: No edema.  Lymphadenopathy:     Cervical: No cervical adenopathy.  Skin:    General: Skin is warm and dry.      Neurological:     Mental Status: She is alert and oriented to person, place, and time.     Deep Tendon Reflexes: Reflexes are normal and symmetric.     Reflex Scores:      Brachioradialis reflexes are 2+ on the right side and 2+ on the left side.      Patellar reflexes are 2+ on the right side and 2+ on the left side. Psychiatric:        Attention and Perception: Attention normal.        Mood and Affect: Mood normal.        Speech: Speech normal.         Behavior: Behavior normal. Behavior is cooperative.        Thought Content: Thought content normal.     Results for orders placed or performed in visit on 02/01/23  Microscopic Examination   Collection Time: 02/01/23  4:17 PM   Urine  Result Value Ref Range   WBC, UA 0-5 0 - 5 /hpf   RBC, Urine None seen 0 - 2 /hpf   Epithelial Cells (non renal) 0-10 0 - 10 /hpf   Bacteria, UA Moderate (A) None seen/Few  Urinalysis, Routine w reflex microscopic   Collection Time: 02/01/23  4:17 PM  Result Value Ref Range   Specific Gravity, UA 1.010 1.005 - 1.030   pH, UA 6.0 5.0 - 7.5   Color, UA Yellow Yellow   Appearance Ur Clear Clear   Leukocytes,UA Trace (A) Negative   Protein,UA Negative Negative/Trace   Glucose, UA Negative Negative   Ketones, UA Negative Negative   RBC, UA Negative Negative   Bilirubin, UA Negative Negative   Urobilinogen, Ur 0.2 0.2 - 1.0 mg/dL   Nitrite, UA Negative Negative   Microscopic Examination See below:   WET PREP FOR TRICH, YEAST, CLUE   Collection Time: 02/01/23  4:42 PM   Specimen: Sterile Swab   Sterile Swab  Result Value Ref Range   Trichomonas Exam Negative Negative   Yeast Exam Negative Negative   Clue Cell Exam Positive (A) Negative  Urine Culture   Collection Time: 02/01/23  4:49 PM   Specimen: Urine   UR  Result Value Ref Range   Urine Culture, Routine Final report (A)    Organism ID, Bacteria Citrobacter koseri (A)    Antimicrobial Susceptibility Comment       Assessment & Plan:   Problem List Items Addressed This Visit       Other   Soft tissue mass - Primary   Long term one to upper occiput and newer one to medial aspect lower left leg.  Will continue to monitor one on occiput as overall small in size and no changes, ?sebaceous cyst. To left lower leg will obtain soft tissue ultrasound to further assess.  Denies recent injuries or bites.  ?cyst, is having some tenderness to area.  Determine next steps after imaging returns.       Relevant Orders   US  LT LOWER EXTREM LTD SOFT TISSUE NON VASCULAR     Follow up plan: Return if symptoms worsen or fail to improve.

## 2023-08-29 NOTE — Assessment & Plan Note (Signed)
 Long term one to upper occiput and newer one to medial aspect lower left leg.  Will continue to monitor one on occiput as overall small in size and no changes, ?sebaceous cyst. To left lower leg will obtain soft tissue ultrasound to further assess.  Denies recent injuries or bites.  ?cyst, is having some tenderness to area.  Determine next steps after imaging returns.

## 2023-09-08 ENCOUNTER — Telehealth: Payer: Self-pay

## 2023-09-08 NOTE — Telephone Encounter (Signed)
Called and notified patient of providers message. Patient verbalized understanding.  

## 2023-09-08 NOTE — Telephone Encounter (Signed)
 Copied from CRM [id number]. Topic: General - Other >> Sep 07, 2023  4:22 PM Santiya F wrote: Reason for CRM: Patient is calling in because she has an ultrasound scheduled on Tuesday, and the spot has gone down and she wants to know if it is still necessary. Please follow up with patient.

## 2023-09-08 NOTE — Telephone Encounter (Signed)
 Routing to provider to advise.

## 2023-09-12 ENCOUNTER — Ambulatory Visit: Admission: RE | Admit: 2023-09-12 | Source: Ambulatory Visit

## 2023-09-15 ENCOUNTER — Ambulatory Visit
Admission: RE | Admit: 2023-09-15 | Discharge: 2023-09-15 | Disposition: A | Discharge status: Home / Self Care | Source: Ambulatory Visit | Attending: Nurse Practitioner | Admitting: Nurse Practitioner

## 2023-09-15 DIAGNOSIS — Z1231 Encounter for screening mammogram for malignant neoplasm of breast: Secondary | ICD-10-CM | POA: Insufficient documentation

## 2023-09-20 ENCOUNTER — Ambulatory Visit: Payer: Self-pay | Admitting: Nurse Practitioner

## 2023-09-20 NOTE — Progress Notes (Signed)
 Contacted via MyChart   Normal mammogram, may repeat in one year:)

## 2023-10-02 ENCOUNTER — Ambulatory Visit: Payer: Self-pay

## 2023-10-02 NOTE — Telephone Encounter (Signed)
 FYI Only or Action Required?: FYI only for provider.  Patient was last seen in primary care on 08/29/2023 by Valerio Melanie DASEN, NP.  Called Nurse Triage reporting Pelvic Pain.  Symptoms began several days ago.  Interventions attempted: Nothing.  Symptoms are: gradually worsening.  Triage Disposition: See Physician Within 24 Hours  Patient/caregiver understands and will follow disposition?: Unsure  Copied from CRM [id number]. Topic: Clinical - Red Word Triage >> Oct 02, 2023  9:14 AM Carlyon D wrote: Red Word that prompted transfer to Nurse Triage: pain in pelvic area, order when urinating, Reason for Disposition  Bad or foul-smelling urine  Answer Assessment - Initial Assessment Questions 1. SYMPTOM: What's the main symptom you're concerned about? (e.g., frequency, incontinence)     Odor when urinating,  pelvic pain 2. ONSET: When did the  odor and pelvic pain  start?     About 2 days ago 3. PAIN: Is there any pain? If Yes, ask: How bad is it? (Scale: 1-10; mild, moderate, severe)     3 4. CAUSE: What do you think is causing the symptoms?     UTI 5. OTHER SYMPTOMS: Do you have any other symptoms? (e.g., blood in urine, fever, flank pain, pain with urination)     Denies  Pt states that she cannot come to clinic today nor tomorrow until after 1500. RN advised that pt could use UC, pt states that it is more expensive. Pt scheduled tomorrow at pt request. Pt educated on s/s to watch for and to call back if s/s develop. Pt agreeable.  Protocols used: Urinary Symptoms-A-AH

## 2023-10-03 ENCOUNTER — Encounter: Payer: Self-pay | Admitting: Nurse Practitioner

## 2023-10-03 ENCOUNTER — Ambulatory Visit: Admitting: Nurse Practitioner

## 2023-10-03 VITALS — BP 108/69 | HR 76 | Temp 98.1°F | Resp 15 | Ht 62.8 in | Wt 157.4 lb

## 2023-10-03 DIAGNOSIS — R399 Unspecified symptoms and signs involving the genitourinary system: Secondary | ICD-10-CM | POA: Diagnosis not present

## 2023-10-03 DIAGNOSIS — R8281 Pyuria: Secondary | ICD-10-CM

## 2023-10-03 LAB — URINALYSIS, ROUTINE W REFLEX MICROSCOPIC
Bilirubin, UA: NEGATIVE
Glucose, UA: NEGATIVE
Nitrite, UA: NEGATIVE
Protein,UA: NEGATIVE
RBC, UA: NEGATIVE
Specific Gravity, UA: 1.01 (ref 1.005–1.030)
Urobilinogen, Ur: 0.2 mg/dL (ref 0.2–1.0)
pH, UA: 7 (ref 5.0–7.5)

## 2023-10-03 LAB — WET PREP FOR TRICH, YEAST, CLUE
Clue Cell Exam: NEGATIVE
Trichomonas Exam: NEGATIVE
Yeast Exam: NEGATIVE

## 2023-10-03 LAB — MICROSCOPIC EXAMINATION

## 2023-10-03 MED ORDER — CIPROFLOXACIN HCL 500 MG PO TABS: 500.0000 mg | ORAL_TABLET | Freq: Every day | ORAL | 0 refills | Status: AC

## 2023-10-03 NOTE — Patient Instructions (Signed)

## 2023-10-03 NOTE — Progress Notes (Signed)
 BP 108/69 (BP Location: Left Arm, Patient Position: Sitting, Cuff Size: Normal)   Pulse 76   Temp 98.1 F (36.7 C) (Oral)   Resp 15   Ht 5' 2.8 (1.595 m)   Wt 157 lb 6.4 oz (71.4 kg)   LMP  (LMP Unknown)   SpO2 98%   BMI 28.06 kg/m    Subjective:    Patient ID: Kelly Fleming, female    DOB: Mar 17, 1965, 58 y.o.   MRN: [id number]  HPI: Kelly Fleming is a 58 y.o. female  Chief Complaint  Patient presents with   Urinary Tract Infection    Started Saturday- pelvis pain, cloudy urine, foul odor. No bleeding or flank pain.    URINARY SYMPTOMS Pelvic pain started on Saturday and some odor and cloudy urine present. Dysuria: no Urinary frequency: yes Urgency: no Small volume voids: no Symptom severity: yes Urinary incontinence: no Foul odor: yes Hematuria: no Abdominal pain: no Back pain: no Suprapubic pain/pressure: some Flank pain: no Fever:  no Vomiting: no Status: stable Previous urinary tract infection: yes Recurrent urinary tract infection: no Sexual activity: monogamous History of sexually transmitted disease: no Treatments attempted: none    Relevant past medical, surgical, family and social history reviewed and updated as indicated. Interim medical history since our last visit reviewed. Allergies and medications reviewed and updated.  Review of Systems  Constitutional:  Negative for activity change, appetite change, diaphoresis, fatigue and fever.  Respiratory:  Negative for cough, chest tightness, shortness of breath and wheezing.   Cardiovascular:  Negative for chest pain, palpitations and leg swelling.  Gastrointestinal: Negative.   Genitourinary:  Positive for frequency. Negative for decreased urine volume, difficulty urinating, dysuria, hematuria and urgency.  Neurological: Negative.   Psychiatric/Behavioral: Negative.      Per HPI unless specifically indicated above     Objective:    BP 108/69 (BP Location: Left Arm, Patient Position:  Sitting, Cuff Size: Normal)   Pulse 76   Temp 98.1 F (36.7 C) (Oral)   Resp 15   Ht 5' 2.8 (1.595 m)   Wt 157 lb 6.4 oz (71.4 kg)   LMP  (LMP Unknown)   SpO2 98%   BMI 28.06 kg/m   Wt Readings from Last 3 Encounters:  10/03/23 157 lb 6.4 oz (71.4 kg)  08/29/23 156 lb 6.4 oz (70.9 kg)  02/01/23 152 lb 3.2 oz (69 kg)    Physical Exam Vitals and nursing note reviewed.  Constitutional:      General: She is awake. She is not in acute distress.    Appearance: She is well-developed and well-groomed. She is not ill-appearing or toxic-appearing.  HENT:     Head: Normocephalic.     Right Ear: Hearing and external ear normal.     Left Ear: Hearing and external ear normal.  Eyes:     General: Lids are normal.        Right eye: No discharge.        Left eye: No discharge.     Conjunctiva/sclera: Conjunctivae normal.     Pupils: Pupils are equal, round, and reactive to light.  Neck:     Thyroid: No thyromegaly.     Vascular: No carotid bruit.  Cardiovascular:     Rate and Rhythm: Normal rate and regular rhythm.     Heart sounds: Normal heart sounds. No murmur heard.    No gallop.  Pulmonary:     Effort: Pulmonary effort is normal. No accessory muscle usage or respiratory  distress.     Breath sounds: Normal breath sounds.  Abdominal:     General: Bowel sounds are normal. There is no distension.     Palpations: Abdomen is soft.     Tenderness: There is no abdominal tenderness. There is no right CVA tenderness or left CVA tenderness.  Musculoskeletal:     Cervical back: Normal range of motion and neck supple.     Right lower leg: No edema.     Left lower leg: No edema.  Lymphadenopathy:     Cervical: No cervical adenopathy.  Skin:    General: Skin is warm and dry.  Neurological:     Mental Status: She is alert and oriented to person, place, and time.     Deep Tendon Reflexes: Reflexes are normal and symmetric.     Reflex Scores:      Brachioradialis reflexes are 2+ on the  right side and 2+ on the left side.      Patellar reflexes are 2+ on the right side and 2+ on the left side. Psychiatric:        Attention and Perception: Attention normal.        Mood and Affect: Mood normal.        Speech: Speech normal.        Behavior: Behavior normal. Behavior is cooperative.        Thought Content: Thought content normal.     Results for orders placed or performed in visit on 02/01/23  Microscopic Examination   Collection Time: 02/01/23  4:17 PM   Urine  Result Value Ref Range   WBC, UA 0-5 0 - 5 /hpf   RBC, Urine None seen 0 - 2 /hpf   Epithelial Cells (non renal) 0-10 0 - 10 /hpf   Bacteria, UA Moderate (A) None seen/Few  Urinalysis, Routine w reflex microscopic   Collection Time: 02/01/23  4:17 PM  Result Value Ref Range   Specific Gravity, UA 1.010 1.005 - 1.030   pH, UA 6.0 5.0 - 7.5   Color, UA Yellow Yellow   Appearance Ur Clear Clear   Leukocytes,UA Trace (A) Negative   Protein,UA Negative Negative/Trace   Glucose, UA Negative Negative   Ketones, UA Negative Negative   RBC, UA Negative Negative   Bilirubin, UA Negative Negative   Urobilinogen, Ur 0.2 0.2 - 1.0 mg/dL   Nitrite, UA Negative Negative   Microscopic Examination See below:   WET PREP FOR TRICH, YEAST, CLUE   Collection Time: 02/01/23  4:42 PM   Specimen: Sterile Swab   Sterile Swab  Result Value Ref Range   Trichomonas Exam Negative Negative   Yeast Exam Negative Negative   Clue Cell Exam Positive (A) Negative  Urine Culture   Collection Time: 02/01/23  4:49 PM   Specimen: Urine   UR  Result Value Ref Range   Urine Culture, Routine Final report (A)    Organism ID, Bacteria Citrobacter koseri (A)    Antimicrobial Susceptibility Comment       Assessment & Plan:   Problem List Items Addressed This Visit       Other   Urinary symptom or sign - Primary   Acute for a few days.  UA showing Leuks and bacteria.  Will send for culture.  Start Cipro  500 MG daily for 5 days.   This has worked well for her in past on review.  Recommend she increase water  intake as trace ketones present. Discussed with her if culture returns  showing need for alternate medication or no infection, then will alert her.      Relevant Orders   Urinalysis, Routine w reflex microscopic   WET PREP FOR TRICH, YEAST, CLUE   Other Visit Diagnoses       Pyuria       Urine sent for culture   Relevant Orders   Urine Culture        Follow up plan: Return if symptoms worsen or fail to improve.

## 2023-10-03 NOTE — Assessment & Plan Note (Signed)
 Acute for a few days.  UA showing Leuks and bacteria.  Will send for culture.  Start Cipro  500 MG daily for 5 days.  This has worked well for her in past on review.  Recommend she increase water  intake as trace ketones present. Discussed with her if culture returns showing need for alternate medication or no infection, then will alert her.

## 2023-10-06 ENCOUNTER — Ambulatory Visit: Payer: Self-pay | Admitting: Nurse Practitioner

## 2023-10-06 LAB — URINE CULTURE

## 2023-10-11 ENCOUNTER — Encounter: Payer: Self-pay | Admitting: Nurse Practitioner

## 2023-12-09 NOTE — Patient Instructions (Signed)
 Be Involved in Caring For Your Health:  Taking Medications When medications are taken as directed, they can greatly improve your health. But if they are not taken as prescribed, they may not work. In some cases, not taking them correctly can be harmful. To help ensure your treatment remains effective and safe, understand your medications and how to take them. Bring your medications to each visit for review by your provider.  Your lab results, notes, and after visit summary will be available on My Chart. We strongly encourage you to use this feature. If lab results are abnormal the clinic will contact you with the appropriate steps. If the clinic does not contact you assume the results are satisfactory. You can always view your results on My Chart. If you have questions regarding your health or results, please contact the clinic during office hours. You can also ask questions on My Chart.  We at [facility] are grateful that you chose us  to provide your care. We strive to provide evidence-based and compassionate care and are always looking for feedback. If you get a survey from the clinic please complete this so we can hear your opinions.  Healthy Eating, Adult Healthy eating may help you get and keep a healthy body weight, reduce the risk of chronic disease, and live a long and productive life. It is important to follow a healthy eating pattern. Your nutritional and calorie needs should be met mainly by different nutrient-rich foods. What are tips for following this plan? Reading food labels Read labels and choose the following: Reduced or low sodium products. Juices with 100% fruit juice. Foods with low saturated fats (<3 g per serving) and high polyunsaturated and monounsaturated fats. Foods with whole grains, such as whole wheat, cracked wheat, brown rice, and wild rice. Whole grains that are fortified with folic acid. This is recommended for females who are pregnant or who want to  become pregnant. Read labels and do not eat or drink the following: Foods or drinks with added sugars. These include foods that contain brown sugar, corn sweetener, corn syrup, dextrose , fructose, glucose, high-fructose corn syrup, honey, invert sugar, lactose, malt syrup, maltose, molasses, raw sugar, sucrose, trehalose, or turbinado sugar. Limit your intake of added sugars to less than 10% of your total daily calories. Do not eat more than the following amounts of added sugar per day: 6 teaspoons (25 g) for females. 9 teaspoons (38 g) for males. Foods that contain processed or refined starches and grains. Refined grain products, such as white flour, degermed cornmeal, white bread, and white rice. Shopping Choose nutrient-rich snacks, such as vegetables, whole fruits, and nuts. Avoid high-calorie and high-sugar snacks, such as potato chips, fruit snacks, and candy. Use oil-based dressings and spreads on foods instead of solid fats such as butter, margarine, sour cream, or cream cheese. Limit pre-made sauces, mixes, and instant products such as flavored rice, instant noodles, and ready-made pasta. Try more plant-protein sources, such as tofu, tempeh, black beans, edamame, lentils, nuts, and seeds. Explore eating plans such as the Mediterranean diet or vegetarian diet. Try heart-healthy dips made with beans and healthy fats like hummus and guacamole. Vegetables go great with these. Cooking Use oil to saut or stir-fry foods instead of solid fats such as butter, margarine, or lard. Try baking, boiling, grilling, or broiling instead of frying. Remove the fatty part of meats before cooking. Steam vegetables in water  or broth. Meal planning  At meals, imagine dividing your plate into fourths: One-half of  your plate is fruits and vegetables. One-fourth of your plate is whole grains. One-fourth of your plate is protein, especially lean meats, poultry, eggs, tofu, beans, or nuts. Include low-fat  dairy as part of your daily diet. Lifestyle Choose healthy options in all settings, including home, work, school, restaurants, or stores. Prepare your food safely: Wash your hands after handling raw meats. Where you prepare food, keep surfaces clean by regularly washing with hot, soapy water . Keep raw meats separate from ready-to-eat foods, such as fruits and vegetables. Cook seafood, meat, poultry, and eggs to the recommended temperature. Get a food thermometer. Store foods at safe temperatures. In general: Keep cold foods at 84F (4.4C) or below. Keep hot foods at 184F (60C) or above. Keep your freezer at [facility] (-17.8C) or below. Foods are not safe to eat if they have been between the temperatures of 40-184F (4.4-60C) for more than 2 hours. What foods should I eat? Fruits Aim to eat 1-2 cups of fresh, canned (in natural juice), or frozen fruits each day. One cup of fruit equals 1 small apple, 1 large banana, 8 large strawberries, 1 cup (237 g) canned fruit,  cup (82 g) dried fruit, or 1 cup (240 mL) 100% juice. Vegetables Aim to eat 2-4 cups of fresh and frozen vegetables each day, including different varieties and colors. One cup of vegetables equals 1 cup (91 g) broccoli or cauliflower florets, 2 medium carrots, 2 cups (150 g) raw, leafy greens, 1 large tomato, 1 large bell pepper, 1 large sweet potato, or 1 medium white potato. Grains Aim to eat 5-10 ounce-equivalents of whole grains each day. Examples of 1 ounce-equivalent of grains include 1 slice of bread, 1 cup (40 g) ready-to-eat cereal, 3 cups (24 g) popcorn, or  cup (93 g) cooked rice. Meats and other proteins Try to eat 5-7 ounce-equivalents of protein each day. Examples of 1 ounce-equivalent of protein include 1 egg,  oz nuts (12 almonds, 24 pistachios, or 7 walnut halves), 1/4 cup (90 g) cooked beans, 6 tablespoons (90 g) hummus or 1 tablespoon (16 g) peanut butter. A cut of meat or fish that is the size of a deck of  cards is about 3-4 ounce-equivalents (85 g). Of the protein you eat each week, try to have at least 8 sounce (227 g) of seafood. This is about 2 servings per week. This includes salmon, trout, herring, sardines, and anchovies. Dairy Aim to eat 3 cup-equivalents of fat-free or low-fat dairy each day. Examples of 1 cup-equivalent of dairy include 1 cup (240 mL) milk, 8 ounces (250 g) yogurt, 1 ounces (44 g) natural cheese, or 1 cup (240 mL) fortified soy milk. Fats and oils Aim for about 5 teaspoons (21 g) of fats and oils per day. Choose monounsaturated fats, such as canola and olive oils, mayonnaise made with olive oil or avocado oil, avocados, peanut butter, and most nuts, or polyunsaturated fats, such as sunflower, corn, and soybean oils, walnuts, pine nuts, sesame seeds, sunflower seeds, and flaxseed. Beverages Aim for 6 eight-ounce glasses of water  per day. Limit coffee to 3-5 eight-ounce cups per day. Limit caffeinated beverages that have added calories, such as soda and energy drinks. If you drink alcohol: Limit how much you have to: 0-1 drink a day if you are female. 0-2 drinks a day if you are female. Know how much alcohol is in your drink. In the [place] one drink is one 12 oz bottle of beer (355 mL), one 5 oz glass of wine (  148 mL), or one 1 oz glass of hard liquor (44 mL). Seasoning and other foods Try not to add too much salt to your food. Try using herbs and spices instead of salt. Try not to add sugar to food. This information is based on [place] nutrition guidelines. To learn more, visit [employer]. Exact amounts may vary. You may need different amounts. This information is not intended to replace advice given to you by your health care provider. Make sure you discuss any questions you have with your health care provider. Document Revised: 09/27/2021 Document Reviewed: 09/27/2021 Elsevier Patient Education  2024 [employer].

## 2023-12-12 ENCOUNTER — Ambulatory Visit (INDEPENDENT_AMBULATORY_CARE_PROVIDER_SITE_OTHER): Admitting: Nurse Practitioner

## 2023-12-12 ENCOUNTER — Encounter: Payer: Self-pay | Admitting: Nurse Practitioner

## 2023-12-12 VITALS — BP 104/66 | HR 76 | Temp 98.5°F | Resp 15 | Ht 63.0 in | Wt 154.6 lb

## 2023-12-12 DIAGNOSIS — E559 Vitamin D deficiency, unspecified: Secondary | ICD-10-CM | POA: Insufficient documentation

## 2023-12-12 DIAGNOSIS — Z2821 Immunization not carried out because of patient refusal: Secondary | ICD-10-CM

## 2023-12-12 DIAGNOSIS — E7800 Pure hypercholesterolemia, unspecified: Secondary | ICD-10-CM

## 2023-12-12 DIAGNOSIS — Z0000 Encounter for general adult medical examination without abnormal findings: Secondary | ICD-10-CM

## 2023-12-12 NOTE — Assessment & Plan Note (Signed)
Chronic, ongoing.  Continue supplement at home and check level today.

## 2023-12-12 NOTE — Assessment & Plan Note (Signed)
 Noted on past labs, discussed with patient and educated.  Continue diet and exercise focus, low risk.  Labs today. The 10-year ASCVD risk score (Arnett DK, et al., 2019) is: 2%   Values used to calculate the score:     Age: 58 years     Clincally relevant sex: Female     Is Non-Hispanic African American: No     Diabetic: No     Tobacco smoker: No     Systolic Blood Pressure: 104 mmHg     Is BP treated: No     HDL Cholesterol: 56 mg/dL     Total Cholesterol: 234 mg/dL

## 2023-12-12 NOTE — Assessment & Plan Note (Signed)
 Refuses flu vaccine, educated on this.

## 2023-12-12 NOTE — Progress Notes (Signed)
 BP 104/66 (BP Location: Left Arm, Patient Position: Sitting, Cuff Size: Normal)   Pulse 76   Temp 98.5 F (36.9 C) (Oral)   Resp 15   Ht 5' 3 (1.6 m)   Wt 154 lb 9.6 oz (70.1 kg)   LMP  (LMP Unknown)   SpO2 100%   BMI 27.39 kg/m    Subjective:    Patient ID: Kelly Fleming, female    DOB: March 31, 1965, 58 y.o.   MRN: [id number]  HPI: Kelly Fleming is a 58 y.o. female presenting on 12/12/2023 for comprehensive medical examination. Current medical complaints include:none  She currently lives with: husband Menopausal Symptoms: no  The 10-year ASCVD risk score (Arnett DK, et al., 2019) is: 2%   Values used to calculate the score:     Age: 35 years     Clincally relevant sex: Female     Is Non-Hispanic African American: No     Diabetic: No     Tobacco smoker: No     Systolic Blood Pressure: 104 mmHg     Is BP treated: No     HDL Cholesterol: 56 mg/dL     Total Cholesterol: 234 mg/dL     87/07/7972    8:46 PM 08/29/2023    4:32 PM 02/01/2023    4:36 PM 12/07/2022    1:11 PM 11/24/2021    1:02 PM  Depression screen PHQ 2/9  Decreased Interest 0 0 0 0 0  Down, Depressed, Hopeless 0 0 1 0 0  PHQ - 2 Score 0 0 1 0 0  Altered sleeping 0 0 0 0 0  Tired, decreased energy 0 1 1 1 1   Change in appetite 0 0 0 0 0  Feeling bad or failure about yourself  0 0 0 0 0  Trouble concentrating 0 0 0 0 0  Moving slowly or fidgety/restless 0 0 0 0 0  Suicidal thoughts 0 0 0 0 0  PHQ-9 Score 0 1  2  1  1    Difficult doing work/chores  Somewhat difficult  Not difficult at all Not difficult at all     Data saved with a previous flowsheet row definition       12/12/2023    1:53 PM 08/29/2023    4:33 PM 02/01/2023    4:36 PM 12/07/2022    1:11 PM  GAD 7 : Generalized Anxiety Score  Nervous, Anxious, on Edge 0 0 1 1  Control/stop worrying 0 0 1 0  Worry too much - different things 0 0 1 1  Trouble relaxing 0 0 1 0  Restless 0 0 0 0  Easily annoyed or irritable 0 0 1 0  Afraid -  awful might happen 0 0 1 0  Total GAD 7 Score 0 0 6 2  Anxiety Difficulty  Not difficult at all Somewhat difficult Not difficult at all      11/24/2021    1:02 PM 11/24/2021    1:14 PM 12/07/2022    1:10 PM 08/29/2023    4:31 PM 12/12/2023    1:53 PM  Fall Risk  Falls in the past year? 0 0 0 0 0  Was there an injury with Fall? 0  0  0  0  0  Fall Risk Category Calculator 0 0 0 0 0  Fall Risk Category (Retired) Low  Low      (RETIRED) Patient Fall Risk Level Low fall risk  Low fall risk      Patient  at Risk for Falls Due to No Fall Risks No Fall Risks No Fall Risks No Fall Risks No Fall Risks  Fall risk Follow up Falls evaluation completed  Falls prevention discussed  Falls evaluation completed Falls evaluation completed Falls evaluation completed     Data saved with a previous flowsheet row definition    Functional Status Survey:      Past Medical History:  Past Medical History:  Diagnosis Date   Childhood asthma    Dry eyes    GERD (gastroesophageal reflux disease)     Surgical History:  Past Surgical History:  Procedure Laterality Date   COLONOSCOPY WITH PROPOFOL  N/A 11/25/2016   Procedure: COLONOSCOPY WITH PROPOFOL ;  Surgeon: Jinny Carmine, MD;  Location: [facility];  Service: Endoscopy;  Laterality: N/A;   SALPINGECTOMY  2010    Medications:  Current Outpatient Medications on File Prior to Visit  Medication Sig   cholecalciferol (VITAMIN D3) 25 MCG (1000 UNIT) tablet Take 1,000 Units by mouth daily.   Collagen-Vitamin C-Biotin (COLLAGEN PO) Take 2 tablets by mouth daily.   Cranberry 250 MG TABS Take 250 mg by mouth daily.   Magnesium 250 MG CAPS Take 250 mg by mouth daily at 2 PM.   Omega-3 Fatty Acids (FISH OIL PO) Take 1 each by mouth daily.   vitamin E 45 MG (100 UNITS) capsule Take 100 Units by mouth daily.   XIIDRA 5 % SOLN INSTILL 1 DROP OU BID   No current facility-administered medications on file prior to visit.    Allergies:  Allergies   Allergen Reactions   Fish Allergy Anaphylaxis    Freshwater fish cause asthma symptoms. 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[place] [employer]    Comment: [place] [employer] [place]   Tobacco Use   Smoking status: Never   Smokeless tobacco: Never  Vaping Use   Vaping status: Never Used  Substance and Sexual Activity   Alcohol use: No   Drug use: No   Sexual activity: Yes    Birth control/protection: None  Other Topics Concern   Not on file  Social History Narrative   Not on file   Social Drivers of Health   Financial Resource Strain: Low Risk  (12/07/2022)   Overall Financial Resource Strain (CARDIA)    Difficulty of Paying Living Expenses: Not hard at all  Food Insecurity: No Food Insecurity (12/07/2022)   Hunger Vital Sign    Worried About Running Out of Food in the Last Year: Never true    Ran Out of Food in the Last Year: Never true  Transportation Needs: No Transportation Needs (12/07/2022)   PRAPARE - Administrator, Civil Service (Medical): No    Lack of Transportation (Non-Medical): No  Physical Activity: Sufficiently Active (12/07/2022)   Exercise Vital Sign    Days of Exercise per Week: 6 days    Minutes of Exercise per Session: 30 min  Stress: No Stress Concern Present (12/07/2022)   [employer] of Occupational Health - Occupational Stress Questionnaire    Feeling of Stress : Only a  little  Social Connections: Moderately Isolated (12/07/2022)   Social Connection and Isolation Panel    Frequency of Communication with Friends and Family: Never    Frequency of Social Gatherings with Friends and Family: Never     Attends Religious Services: More than 4 times per year    Active Member of [employer] or Organizations: No    Attends Banker Meetings: Never    Marital Status: Married  Catering Manager Violence: Not At Risk (12/07/2022)   Humiliation, Afraid, Rape, and Kick questionnaire    Fear of Current or Ex-Partner: No    Emotionally Abused: No    Physically Abused: No    Sexually Abused: No   Social History   Tobacco Use  Smoking Status Never  Smokeless Tobacco Never   Social History   Substance and Sexual Activity  Alcohol Use No    Family History:  Family History  Problem Relation Age of Onset   Glaucoma Mother    Alzheimer's disease Mother    Dementia Mother    Diabetes Father    Lung cancer Father    Brain cancer Father    Throat cancer Brother    Healthy Son    Retinal detachment Paternal Aunt    Diabetes Maternal Grandfather    Hyperlipidemia Maternal Grandfather    Diabetes Paternal Grandfather    Hyperlipidemia Paternal Grandfather    Breast cancer Cousin        mat cousin   Ovarian cancer Neg Hx    Colon cancer Neg Hx     Past medical history, surgical history, medications, allergies, family history and social history reviewed with patient today and changes made to appropriate areas of the chart.   ROS All other ROS negative except what is listed above and in the HPI.      Objective:    BP 104/66 (BP Location: Left Arm, Patient Position: Sitting, Cuff Size: Normal)   Pulse 76   Temp 98.5 F (36.9 C) (Oral)   Resp 15   Ht 5' 3 (1.6 m)   Wt 154 lb 9.6 oz (70.1 kg)   LMP  (LMP Unknown)   SpO2 100%   BMI 27.39 kg/m   Wt Readings from Last 3 Encounters:  12/12/23 154 lb 9.6 oz (70.1 kg)  10/03/23 157 lb 6.4 oz (71.4 kg)  08/29/23 156 lb 6.4 oz (70.9 kg)    Physical Exam Constitutional:      General: She is awake. She is not in acute distress.    Appearance: She is well-developed. She is not ill-appearing.  HENT:     Head: Normocephalic and  atraumatic.     Right Ear: Hearing, tympanic membrane, ear canal and external ear normal. No drainage.     Left Ear: Hearing, tympanic membrane, ear canal and external ear normal. No drainage.     Nose: Nose normal.     Right Sinus: No maxillary sinus tenderness or frontal sinus tenderness.     Left Sinus: No maxillary sinus tenderness or frontal sinus tenderness.     Mouth/Throat:     Mouth: Mucous membranes are moist.     Pharynx: Oropharynx is clear. Uvula midline. No pharyngeal swelling, oropharyngeal exudate or posterior oropharyngeal erythema.  Eyes:     General: Lids are normal.        Right eye: No discharge.        Left eye: No discharge.     Extraocular Movements: Extraocular movements intact.     Conjunctiva/sclera:  Conjunctivae normal.     Pupils: Pupils are equal, round, and reactive to light.     Visual Fields: Right eye visual fields normal and left eye visual fields normal.  Neck:     Thyroid: No thyromegaly.     Vascular: No carotid bruit.     Trachea: Trachea normal.  Cardiovascular:     Rate and Rhythm: Normal rate and regular rhythm.     Heart sounds: Normal heart sounds. No murmur heard.    No gallop.  Pulmonary:     Effort: Pulmonary effort is normal. No accessory muscle usage or respiratory distress.     Breath sounds: Normal breath sounds.  Abdominal:     General: Bowel sounds are normal.     Palpations: Abdomen is soft. There is no hepatomegaly or splenomegaly.     Tenderness: There is no abdominal tenderness.  Musculoskeletal:        General: Normal range of motion.     Cervical back: Normal range of motion and neck supple.     Right lower leg: No edema.     Left lower leg: No edema.  Lymphadenopathy:     Head:     Right side of head: No submental, submandibular, tonsillar, preauricular or posterior auricular adenopathy.     Left side of head: No submental, submandibular, tonsillar, preauricular or posterior auricular adenopathy.     Cervical: No  cervical adenopathy.  Skin:    General: Skin is warm and dry.     Capillary Refill: Capillary refill takes less than 2 seconds.     Findings: No rash.  Neurological:     Mental Status: She is alert and oriented to person, place, and time.     Gait: Gait is intact.     Deep Tendon Reflexes: Reflexes are normal and symmetric.     Reflex Scores:      Brachioradialis reflexes are 2+ on the right side and 2+ on the left side.      Patellar reflexes are 2+ on the right side and 2+ on the left side. Psychiatric:        Attention and Perception: Attention normal.        Mood and Affect: Mood normal.        Speech: Speech normal.        Behavior: Behavior normal. Behavior is cooperative.        Thought Content: Thought content normal.        Judgment: Judgment normal.     Results for orders placed or performed in visit on 10/03/23  WET PREP FOR TRICH, YEAST, CLUE   Collection Time: 10/03/23  3:07 PM   Specimen: Urine   Urine  Result Value Ref Range   Trichomonas Exam Negative Negative   Yeast Exam Negative Negative   Clue Cell Exam Negative Negative  Microscopic Examination   Collection Time: 10/03/23  3:07 PM   Urine  Result Value Ref Range   WBC, UA 6-10 (A) 0 - 5 /hpf   RBC, Urine 0-2 0 - 2 /hpf   Epithelial Cells (non renal) 0-10 0 - 10 /hpf   Bacteria, UA Few (A) None seen/Few  Urinalysis, Routine w reflex microscopic   Collection Time: 10/03/23  3:07 PM  Result Value Ref Range   Specific Gravity, UA 1.010 1.005 - 1.030   pH, UA 7.0 5.0 - 7.5   Color, UA Yellow Yellow   Appearance Ur Hazy (A) Clear   Leukocytes,UA 2+ (A) Negative  Protein,UA Negative Negative/Trace   Glucose, UA Negative Negative   Ketones, UA Trace (A) Negative   RBC, UA Negative Negative   Bilirubin, UA Negative Negative   Urobilinogen, Ur 0.2 0.2 - 1.0 mg/dL   Nitrite, UA Negative Negative   Microscopic Examination See below:   Urine Culture   Collection Time: 10/03/23  3:34 PM   Specimen:  Urine   UR  Result Value Ref Range   Urine Culture, Routine Final report (A)    Organism ID, Bacteria Citrobacter [email or web address] (A)    Antimicrobial Susceptibility Comment       Assessment & Plan:   Problem List Items Addressed This Visit       Other   Elevated low density lipoprotein (LDL) cholesterol level - Primary (Chronic)   Noted on past labs, discussed with patient and educated.  Continue diet and exercise focus, low risk.  Labs today. The 10-year ASCVD risk score (Arnett DK, et al., 2019) is: 2%   Values used to calculate the score:     Age: 81 years     Clincally relevant sex: Female     Is Non-Hispanic African American: No     Diabetic: No     Tobacco smoker: No     Systolic Blood Pressure: 104 mmHg     Is BP treated: No     HDL Cholesterol: 56 mg/dL     Total Cholesterol: 234 mg/dL       Relevant Orders   Comprehensive metabolic panel with GFR   Lipid Panel w/o Chol/HDL Ratio   Vitamin D  deficiency   Chronic, ongoing. Continue supplement at home and check level today.      Relevant Orders   VITAMIN D  25 Hydroxy (Vit-D Deficiency, Fractures)   Refused influenza vaccine   Refuses flu vaccine, educated on this.       Other Visit Diagnoses       Encounter for annual physical exam       Relevant Orders   CBC with Differential/Platelet   TSH        Follow up plan: Return in about 1 year (around 12/11/2024) for Annual Physical.   LABORATORY TESTING:  - Pap smear: up to date  IMMUNIZATIONS:   - Tdap: Tetanus vaccination status reviewed: last tetanus booster within 10 years. - Influenza: Refused - Pneumovax: Not applicable - Prevnar: Refuses - COVID: Refused - HPV: Not applicable - Shingrix vaccine: Refused  SCREENING: -Mammogram: Up to date due next September 2026 - Colonoscopy: Up to date  - Bone Density: Not applicable  -Hearing Test: Not applicable  -Spirometry: Not applicable   PATIENT COUNSELING:   Advised to take 1 mg of folate supplement  per day if capable of pregnancy.   Sexuality: Discussed sexually transmitted diseases, partner selection, use of condoms, avoidance of unintended pregnancy  and contraceptive alternatives.   Advised to avoid cigarette smoking.  I discussed with the patient that most people either abstain from alcohol or drink within safe limits (<=14/week and <=4 drinks/occasion for males, <=7/weeks and <= 3 drinks/occasion for females) and that the risk for alcohol disorders and other health effects rises proportionally with the number of drinks per week and how often a drinker exceeds daily limits.  Discussed cessation/primary prevention of drug use and availability of treatment for abuse.   Diet: Encouraged to adjust caloric intake to maintain  or achieve ideal body weight, to reduce intake of dietary saturated fat and total fat, to limit sodium intake by avoiding high  sodium foods and not adding table salt, and to maintain adequate dietary potassium and calcium preferably from fresh fruits, vegetables, and low-fat dairy products.    Stressed the importance of regular exercise  Injury prevention: Discussed safety belts, safety helmets, smoke detector, smoking near bedding or upholstery.   Dental health: Discussed importance of regular tooth brushing, flossing, and dental visits.    NEXT PREVENTATIVE PHYSICAL DUE IN 1 YEAR. Return in about 1 year (around 12/11/2024) for Annual Physical.

## 2023-12-13 ENCOUNTER — Ambulatory Visit: Payer: Self-pay | Admitting: Nurse Practitioner

## 2023-12-13 LAB — LIPID PANEL W/O CHOL/HDL RATIO
Cholesterol, Total: 241 mg/dL — ABNORMAL HIGH (ref 100–199)
HDL: 63 mg/dL (ref >39)
LDL Chol Calc (NIH): 153 mg/dL — ABNORMAL HIGH (ref 0–99)
Triglycerides: 138 mg/dL (ref 0–149)
VLDL Cholesterol Cal: 25 mg/dL (ref 5–40)

## 2023-12-13 LAB — CBC WITH DIFFERENTIAL/PLATELET
Basophils Absolute: 0.1 x10E3/uL (ref 0.0–0.2)
Basos: 1 %
EOS (ABSOLUTE): 0.1 x10E3/uL (ref 0.0–0.4)
Eos: 1 %
Hematocrit: 44.1 % (ref 34.0–46.6)
Hemoglobin: 14.7 g/dL (ref 11.1–15.9)
Immature Grans (Abs): 0 x10E3/uL (ref 0.0–0.1)
Immature Granulocytes: 0 %
Lymphocytes Absolute: 2.6 x10E3/uL (ref 0.7–3.1)
Lymphs: 32 %
MCH: 30.3 pg (ref 26.6–33.0)
MCHC: 33.3 g/dL (ref 31.5–35.7)
MCV: 91 fL (ref 79–97)
Monocytes Absolute: 0.5 x10E3/uL (ref 0.1–0.9)
Monocytes: 6 %
Neutrophils Absolute: 4.7 x10E3/uL (ref 1.4–7.0)
Neutrophils: 60 %
Platelets: 274 x10E3/uL (ref 150–450)
RBC: 4.85 x10E6/uL (ref 3.77–5.28)
RDW: 12.2 % (ref 11.7–15.4)
WBC: 7.9 x10E3/uL (ref 3.4–10.8)

## 2023-12-13 LAB — COMPREHENSIVE METABOLIC PANEL WITH GFR
ALT: 24 IU/L (ref 0–32)
AST: 25 IU/L (ref 0–40)
Albumin: 4.8 g/dL (ref 3.8–4.9)
Alkaline Phosphatase: 101 IU/L (ref 49–135)
BUN/Creatinine Ratio: 14 (ref 9–23)
BUN: 12 mg/dL (ref 6–24)
Bilirubin Total: 0.3 mg/dL (ref 0.0–1.2)
CO2: 25 mmol/L (ref 20–29)
Calcium: 9.7 mg/dL (ref 8.7–10.2)
Chloride: 102 mmol/L (ref 96–106)
Creatinine, Ser: 0.83 mg/dL (ref 0.57–1.00)
Globulin, Total: 2.7 g/dL (ref 1.5–4.5)
Glucose: 105 mg/dL — ABNORMAL HIGH (ref 70–99)
Potassium: 4.3 mmol/L (ref 3.5–5.2)
Sodium: 143 mmol/L (ref 134–144)
Total Protein: 7.5 g/dL (ref 6.0–8.5)
eGFR: 82 mL/min/1.73 (ref >59)

## 2023-12-13 LAB — VITAMIN D 25 HYDROXY (VIT D DEFICIENCY, FRACTURES): Vit D, 25-Hydroxy: 47.5 ng/mL (ref 30.0–100.0)

## 2023-12-13 LAB — TSH: TSH: 2.95 u[IU]/mL (ref 0.450–4.500)

## 2023-12-13 NOTE — Progress Notes (Signed)
 Contacted via MyChart The 10-year ASCVD risk score (Arnett DK, et al., 2019) is: 1.9%   Values used to calculate the score:     Age: 58 years     Clincally relevant sex: Female     Is Non-Hispanic African American: No     Diabetic: No     Tobacco smoker: No     Systolic Blood Pressure: 104 mmHg     Is BP treated: No     HDL Cholesterol: 63 mg/dL     Total Cholesterol: 241 mg/dL  Good morning Donald, your labs have returned: - Kidney function, creatinine and eGFR, remains normal, as is liver function, AST and ALT.  - CBC shows no anemia or infection. - Thyroid level and Vitamin D  are normal. - Lipid panel continues to show elevations. No medications are needed at this time. Focus on healthy diet changes and regular exercise. The American Heart Association has a plethora of information on this. Any questions? Keep being amazing!!  Thank you for allowing me to participate in your care.  I appreciate you. Kindest regards, Kellon Chalk

## 2023-12-15 ENCOUNTER — Encounter: Payer: Self-pay | Admitting: Nurse Practitioner

## 2024-12-13 ENCOUNTER — Encounter: Admitting: Nurse Practitioner
